# Patient Record
Sex: Male | Born: 1955 | Race: White | Hispanic: No | Marital: Married | Smoking: Never smoker
Health system: Southern US, Community
[De-identification: ages and names within clinical notes are randomized; demographics above are authoritative.]

## PROBLEM LIST (undated history)

## (undated) DIAGNOSIS — I2699 Other pulmonary embolism without acute cor pulmonale: Secondary | ICD-10-CM

## (undated) DIAGNOSIS — I82409 Acute embolism and thrombosis of unspecified deep veins of unspecified lower extremity: Secondary | ICD-10-CM

## (undated) DIAGNOSIS — G459 Transient cerebral ischemic attack, unspecified: Secondary | ICD-10-CM

## (undated) DIAGNOSIS — O2230 Deep phlebothrombosis in pregnancy, unspecified trimester: Secondary | ICD-10-CM

## (undated) DIAGNOSIS — F32A Depression, unspecified: Secondary | ICD-10-CM

## (undated) DIAGNOSIS — J3800 Paralysis of vocal cords and larynx, unspecified: Secondary | ICD-10-CM

## (undated) DIAGNOSIS — F329 Major depressive disorder, single episode, unspecified: Secondary | ICD-10-CM

## (undated) HISTORY — PX: APPENDECTOMY: SHX54

## (undated) HISTORY — PX: THROAT SURGERY: SHX803

---

## 2008-09-21 ENCOUNTER — Encounter: Admission: RE | Admit: 2008-09-21 | Discharge: 2008-09-29 | Payer: Self-pay | Admitting: Psychology

## 2008-10-02 ENCOUNTER — Ambulatory Visit: Payer: Self-pay | Admitting: Psychology

## 2009-06-14 ENCOUNTER — Encounter: Admission: RE | Admit: 2009-06-14 | Discharge: 2009-06-14 | Payer: Self-pay | Admitting: Orthopedic Surgery

## 2009-10-18 ENCOUNTER — Encounter: Admission: RE | Admit: 2009-10-18 | Discharge: 2009-10-18 | Payer: Self-pay | Admitting: Orthopedic Surgery

## 2009-10-21 ENCOUNTER — Ambulatory Visit: Payer: Self-pay | Admitting: Hematology & Oncology

## 2009-11-01 LAB — CBC WITH DIFFERENTIAL (CANCER CENTER ONLY)
BASO#: 0 10*3/uL (ref 0.0–0.2)
Eosinophils Absolute: 0.1 10*3/uL (ref 0.0–0.5)
HGB: 15.1 g/dL (ref 13.0–17.1)
LYMPH#: 1.3 10*3/uL (ref 0.9–3.3)
MCH: 32.4 pg (ref 28.0–33.4)
MONO#: 0.3 10*3/uL (ref 0.1–0.9)
NEUT#: 2.3 10*3/uL (ref 1.5–6.5)
Platelets: 210 10*3/uL (ref 145–400)
RBC: 4.65 10*6/uL (ref 4.20–5.70)
WBC: 4 10*3/uL (ref 4.0–10.0)

## 2009-11-01 LAB — CHCC SATELLITE - SMEAR

## 2009-11-08 LAB — HYPERCOAGULABLE PANEL, COMPREHENSIVE
Anticardiolipin IgG: 4 GPL U/mL (ref <23)
DRVVT 1:1 Mix: 46 secs — ABNORMAL HIGH (ref 36.2–44.3)
DRVVT: 80.9 secs — ABNORMAL HIGH (ref 36.2–44.3)
Drvvt confirmation: 1.42 Ratio — ABNORMAL HIGH (ref <1.21)
Homocysteine: 10.1 umol/L (ref 4.0–15.4)
Lupus Anticoagulant: DETECTED — AB
PTT Lupus Anticoagulant: 41 secs (ref 32.0–43.4)
Protein C Activity: 68 % — ABNORMAL LOW (ref 75–133)
Protein C, Total: 90 % (ref 70–140)
Protein S Ag, Total: 92 % (ref 70–140)

## 2009-11-08 LAB — D-DIMER, QUANTITATIVE: D-Dimer, Quant: 0.22 ug/mL-FEU (ref 0.00–0.48)

## 2011-05-31 DIAGNOSIS — J3801 Paralysis of vocal cords and larynx, unilateral: Secondary | ICD-10-CM | POA: Insufficient documentation

## 2011-11-20 DIAGNOSIS — Z86711 Personal history of pulmonary embolism: Secondary | ICD-10-CM | POA: Insufficient documentation

## 2012-03-22 DIAGNOSIS — M542 Cervicalgia: Secondary | ICD-10-CM

## 2012-03-22 HISTORY — DX: Cervicalgia: M54.2

## 2012-08-06 DIAGNOSIS — E785 Hyperlipidemia, unspecified: Secondary | ICD-10-CM | POA: Insufficient documentation

## 2012-08-06 DIAGNOSIS — I2699 Other pulmonary embolism without acute cor pulmonale: Secondary | ICD-10-CM | POA: Insufficient documentation

## 2012-10-15 DIAGNOSIS — Z7901 Long term (current) use of anticoagulants: Secondary | ICD-10-CM | POA: Insufficient documentation

## 2012-10-15 DIAGNOSIS — Z86718 Personal history of other venous thrombosis and embolism: Secondary | ICD-10-CM

## 2012-10-15 DIAGNOSIS — M47812 Spondylosis without myelopathy or radiculopathy, cervical region: Secondary | ICD-10-CM | POA: Insufficient documentation

## 2012-10-15 HISTORY — DX: Personal history of other venous thrombosis and embolism: Z86.718

## 2012-10-16 ENCOUNTER — Telehealth: Payer: Self-pay | Admitting: Hematology & Oncology

## 2012-10-16 NOTE — Telephone Encounter (Signed)
Rcvd fax from [facility] requesting progress and labs notes for this patient. Pt has no notes and labs are old (2011).  Sent fax requested form back saying no notes available and labs are old at this time.    CONSENT COPY SCANNED

## 2015-07-07 DIAGNOSIS — F419 Anxiety disorder, unspecified: Secondary | ICD-10-CM | POA: Insufficient documentation

## 2015-07-07 DIAGNOSIS — G4700 Insomnia, unspecified: Secondary | ICD-10-CM | POA: Insufficient documentation

## 2016-01-10 DIAGNOSIS — M1A9XX0 Chronic gout, unspecified, without tophus (tophi): Secondary | ICD-10-CM | POA: Insufficient documentation

## 2017-03-11 ENCOUNTER — Emergency Department (HOSPITAL_COMMUNITY): Payer: Self-pay

## 2017-03-11 ENCOUNTER — Encounter (HOSPITAL_COMMUNITY): Payer: Self-pay | Admitting: Emergency Medicine

## 2017-03-11 ENCOUNTER — Emergency Department (HOSPITAL_COMMUNITY)
Admission: EM | Admit: 2017-03-11 | Discharge: 2017-03-12 | Disposition: A | Discharge status: Home / Self Care | Payer: Self-pay | Attending: Emergency Medicine | Admitting: Emergency Medicine

## 2017-03-11 DIAGNOSIS — R911 Solitary pulmonary nodule: Secondary | ICD-10-CM | POA: Insufficient documentation

## 2017-03-11 DIAGNOSIS — Z79899 Other long term (current) drug therapy: Secondary | ICD-10-CM | POA: Insufficient documentation

## 2017-03-11 DIAGNOSIS — R109 Unspecified abdominal pain: Secondary | ICD-10-CM | POA: Insufficient documentation

## 2017-03-11 HISTORY — DX: Depression, unspecified: F32.A

## 2017-03-11 HISTORY — DX: Other pulmonary embolism without acute cor pulmonale: I26.99

## 2017-03-11 HISTORY — DX: Paralysis of vocal cords and larynx, unspecified: J38.00

## 2017-03-11 HISTORY — DX: Major depressive disorder, single episode, unspecified: F32.9

## 2017-03-11 HISTORY — DX: Deep phlebothrombosis in pregnancy, unspecified trimester: O22.30

## 2017-03-11 LAB — LIPASE, BLOOD: LIPASE: 18 U/L (ref 11–51)

## 2017-03-11 LAB — URINALYSIS, ROUTINE W REFLEX MICROSCOPIC
BILIRUBIN URINE: NEGATIVE
Bacteria, UA: NONE SEEN
GLUCOSE, UA: NEGATIVE mg/dL
Hgb urine dipstick: NEGATIVE
KETONES UR: 5 mg/dL — AB
LEUKOCYTES UA: NEGATIVE
Nitrite: NEGATIVE
PH: 5 (ref 5.0–8.0)
Protein, ur: 30 mg/dL — AB
Specific Gravity, Urine: 1.028 (ref 1.005–1.030)

## 2017-03-11 LAB — I-STAT TROPONIN, ED: Troponin i, poc: 0.01 ng/mL (ref 0.00–0.08)

## 2017-03-11 LAB — COMPREHENSIVE METABOLIC PANEL
ALK PHOS: 59 U/L (ref 38–126)
ALT: 23 U/L (ref 17–63)
AST: 29 U/L (ref 15–41)
Albumin: 3.8 g/dL (ref 3.5–5.0)
Anion gap: 8 (ref 5–15)
BUN: 13 mg/dL (ref 6–20)
CALCIUM: 8.8 mg/dL — AB (ref 8.9–10.3)
CO2: 25 mmol/L (ref 22–32)
CREATININE: 0.92 mg/dL (ref 0.61–1.24)
Chloride: 102 mmol/L (ref 101–111)
Glucose, Bld: 122 mg/dL — ABNORMAL HIGH (ref 65–99)
Potassium: 3.9 mmol/L (ref 3.5–5.1)
Sodium: 135 mmol/L (ref 135–145)
TOTAL PROTEIN: 6.8 g/dL (ref 6.5–8.1)
Total Bilirubin: 0.8 mg/dL (ref 0.3–1.2)

## 2017-03-11 LAB — CBC
HCT: 43.2 % (ref 39.0–52.0)
Hemoglobin: 14.7 g/dL (ref 13.0–17.0)
MCH: 32 pg (ref 26.0–34.0)
MCHC: 34 g/dL (ref 30.0–36.0)
MCV: 93.9 fL (ref 78.0–100.0)
PLATELETS: 168 10*3/uL (ref 150–400)
RBC: 4.6 MIL/uL (ref 4.22–5.81)
RDW: 13.4 % (ref 11.5–15.5)
WBC: 7.1 10*3/uL (ref 4.0–10.5)

## 2017-03-11 MED ORDER — METOCLOPRAMIDE HCL 5 MG/ML IJ SOLN
10.0000 mg | Freq: Once | INTRAMUSCULAR | Status: AC
  Administered 2017-03-11: 10 mg via INTRAVENOUS
  Filled 2017-03-11: qty 2

## 2017-03-11 MED ORDER — SODIUM CHLORIDE 0.9 % IV BOLUS (SEPSIS)
1000.0000 mL | Freq: Once | INTRAVENOUS | Status: AC
  Administered 2017-03-11: 1000 mL via INTRAVENOUS

## 2017-03-11 MED ORDER — KETOROLAC TROMETHAMINE 30 MG/ML IJ SOLN
15.0000 mg | Freq: Once | INTRAMUSCULAR | Status: AC
  Administered 2017-03-11: 15 mg via INTRAVENOUS
  Filled 2017-03-11: qty 1

## 2017-03-11 MED ORDER — MORPHINE SULFATE (PF) 4 MG/ML IV SOLN
4.0000 mg | Freq: Once | INTRAVENOUS | Status: AC
  Administered 2017-03-11: 4 mg via INTRAVENOUS
  Filled 2017-03-11: qty 1

## 2017-03-11 MED ORDER — ONDANSETRON HCL 4 MG/2ML IJ SOLN
4.0000 mg | Freq: Once | INTRAMUSCULAR | Status: AC
  Administered 2017-03-11: 4 mg via INTRAVENOUS
  Filled 2017-03-11: qty 2

## 2017-03-11 NOTE — ED Triage Notes (Signed)
C/o severe nausea since yesterday.  Took Phenergan 2-3 hours ago without relief.  Reports posterior R rib pain that he believes is from recent coughing.  Decreased urination and foul smelling urine.

## 2017-03-11 NOTE — ED Provider Notes (Signed)
[facility]-EMERGENCY DEPT Provider Note   CSN: [id number] Arrival date & time: 03/11/17  1937     History   Chief Complaint Chief Complaint  Patient presents with  . Nausea  . R posterior rib pain    HPI Lawrence Carlson is a 61 y.o. male.  HPI Patient presents with concern of nausea, weakness, right posterior inferior axillary pain. About one week ago the patient developed generalized fatigue, mild cough. Over the past 2 days or so the patient felt increasing pain in the right flank, and lower ribs anteriorly. No dysuria, no hematuria. No other abdominal pain, no chest pain. No dyspnea. No fever. Patient is here with his son in law who assists with the history of present illness. Since onset minimal relief with OTC medication. Patient is a notable history of DVT/PE, but is no longer on anticoagulant. No recent medication changes, travel, diet changes, activity changes. Past Medical History:  Diagnosis Date  . Depression   . DVT (deep vein thrombosis) in pregnancy (HCC)   . Paralyzed vocal cords   . Pulmonary embolism (HCC)     There are no active problems to display for this patient.   Past Surgical History:  Procedure Laterality Date  . APPENDECTOMY    . THROAT SURGERY         Home Medications    Prior to Admission medications   Medication Sig Start Date End Date Taking? Authorizing Provider  Calcium-Magnesium-Vitamin D (CALCIUM MAGNESIUM PO) Take 1 tablet by mouth daily.   Yes [provider]  clonazePAM (KLONOPIN) 0.5 MG tablet Take 0.5 mg by mouth 2 (two) times daily as needed (to relax vocal cords).   Yes [provider]  escitalopram (LEXAPRO) 10 MG tablet Take 10 mg by mouth at bedtime.   Yes [provider]  Multiple Vitamin (MULTIVITAMIN WITH MINERALS) TABS tablet Take 1 tablet by mouth daily.   Yes [provider]  vitamin B-12 (CYANOCOBALAMIN) 1000 MCG tablet Take 5,000 mcg by mouth daily.   Yes [provider]   zolpidem (AMBIEN) 5 MG tablet Take 5 mg by mouth at bedtime.   Yes [provider]    Family History No family history on file.  Social History Social History  Substance Use Topics  . Smoking status: Never Smoker  . Smokeless tobacco: Never Used  . Alcohol use No     Allergies   Patient has no known allergies.   Review of Systems Review of Systems  Constitutional:       Per HPI, otherwise negative  HENT:       Per HPI, otherwise negative  Respiratory:       Per HPI, otherwise negative  Cardiovascular:       Per HPI, otherwise negative  Gastrointestinal: Positive for nausea. Negative for vomiting.  Endocrine:       Negative aside from HPI  Genitourinary:       Neg aside from HPI   Musculoskeletal:       Per HPI, otherwise negative  Skin: Negative.   Neurological: Positive for weakness. Negative for syncope.     Physical Exam Updated Vital Signs BP 130/87 (BP Location: Left Arm)   Pulse 60   Temp 97.8 F (36.6 C) (Oral)   Resp 16   Ht  (1.803 m)   Wt 85.3 kg (188 lb)   SpO2 100%   BMI 26.22 kg/m   Physical Exam  Constitutional: He is oriented to person, place, and time. He appears  well-developed. No distress.  Uncomfortable appearing male resting in supine position, answering questions appropriately  HENT:  Head: Normocephalic and atraumatic.  Eyes: Conjunctivae and EOM are normal.  Cardiovascular: Normal rate and regular rhythm.   Pulmonary/Chest: Effort normal. No stridor. No respiratory distress.  Abdominal: He exhibits no distension.    Musculoskeletal: He exhibits no edema.  Neurological: He is alert and oriented to person, place, and time.  Skin: Skin is warm and dry.  Psychiatric: He has a normal mood and affect.  Nursing note and vitals reviewed.    ED Treatments / Results  Labs (all labs ordered are listed, but only abnormal results are displayed) Labs Reviewed  COMPREHENSIVE METABOLIC PANEL - Abnormal; Notable for  the following:       Result Value   Glucose, Bld 122 (*)    Calcium 8.8 (*)    All other components within normal limits  LIPASE, BLOOD  CBC  URINALYSIS, ROUTINE W REFLEX MICROSCOPIC  I-STAT TROPONIN, ED    EKG  EKG Interpretation  Date/Time:  Sunday March 11 2017 19:50:22 EDT Ventricular Rate:  51 PR Interval:  166 QRS Duration: 82 QT Interval:  428 QTC Calculation: 394 R Axis:   78 Text Interpretation:  Sinus bradycardia Otherwise within normal limits Confirmed by Gerhard Munch [phone]) on 03/11/2017 8:23:33 PM       Radiology Dg Ribs Unilateral W/chest Right  Result Date: 03/11/2017 CLINICAL DATA:  Right rib pain.  Coughing for 2 weeks. EXAM: RIGHT RIBS AND CHEST - 3+ VIEW COMPARISON:  07/25/2015, 03/15/2011 FINDINGS: Right rib detail images are negative for fracture or focal bone abnormality. The lungs are clear. The pulmonary vasculature is normal. There is no pleural effusion. Heart size is normal. Hilar and mediastinal contours are unremarkable. Chronic shoulder arthropathy bilaterally. IMPRESSION: No acute findings. Electronically Signed   By: Ellery Plunk M.D.   On: 03/11/2017 20:34    Procedures Procedures (including critical care time)  Medications Ordered in ED Medications  sodium chloride 0.9 % bolus 1,000 mL (not administered)  sodium chloride 0.9 % bolus 1,000 mL (1,000 mLs Intravenous New Bag/Given 03/11/17 2048)  ondansetron (ZOFRAN) injection 4 mg (4 mg Intravenous Given 03/11/17 2048)  ketorolac (TORADOL) 30 MG/ML injection 15 mg (15 mg Intravenous Given 03/11/17 2120)  morphine 4 MG/ML injection 4 mg (4 mg Intravenous Given 03/11/17 2122)     Initial Impression / Assessment and Plan / ED Course  I have reviewed the triage vital signs and the nursing notes.  Pertinent labs & imaging results that were available during my care of the patient were reviewed by me and considered in my medical decision making (see chart for details).      Update: After initial resuscitation with fluids, antiemetics, analgesia, the patient continues to have pain, nausea.   Update:, Labs, urinalysis, x-ray reviewed the patient and his family member. Initial studies reassuring. After multiple medications, patient remains symptomatic, and given concern for his location of pain in the right upper abdomen, right flank, patient wants CT scan performed. Labs, vitals as far reassuring, patient's care endorsed to Dr. Wilkie Aye, who will evaluate the CT results and check on the patient.   Final Clinical Impressions(s) / ED Diagnoses  Right sided pain Nausea   Gerhard Munch, MD 03/12/17 0013

## 2017-03-12 ENCOUNTER — Emergency Department (HOSPITAL_COMMUNITY): Payer: Self-pay

## 2017-03-12 MED ORDER — HYDROMORPHONE HCL 1 MG/ML IJ SOLN
0.5000 mg | Freq: Once | INTRAMUSCULAR | Status: AC
  Administered 2017-03-12: 0.5 mg via INTRAVENOUS
  Filled 2017-03-12: qty 1

## 2017-03-12 MED ORDER — IOPAMIDOL (ISOVUE-300) INJECTION 61%
INTRAVENOUS | Status: AC
  Filled 2017-03-12: qty 100

## 2017-03-12 MED ORDER — IOPAMIDOL (ISOVUE-300) INJECTION 61%
100.0000 mL | Freq: Once | INTRAVENOUS | Status: AC | PRN
  Administered 2017-03-12: 100 mL via INTRAVENOUS

## 2017-03-12 NOTE — ED Notes (Signed)
Pt taken to CT.

## 2017-03-12 NOTE — ED Notes (Signed)
Provider at bedside

## 2017-03-12 NOTE — Discharge Instructions (Signed)
Workup today is reassuring. Follow-up with her primary physician. You do have evidence of a slow growing pulmonary nodule. Follow-up CT-PET recommended.

## 2017-03-12 NOTE — ED Provider Notes (Signed)
Patient signed out pending CT scan.    CT scan negative for acute finding. There is a slow growing pulmonary nodule which the patient states he is aware of. Recommend follow-up PET/CT.  On recheck, patient states he feels more comfortable. He is able to tolerate fluids. Will discharge home with primary care follow-up.   Shon Baton, MD 03/12/17 [phone]

## 2017-04-10 ENCOUNTER — Other Ambulatory Visit (HOSPITAL_COMMUNITY): Payer: Self-pay | Admitting: Family Medicine

## 2017-04-10 DIAGNOSIS — R911 Solitary pulmonary nodule: Secondary | ICD-10-CM

## 2017-04-24 ENCOUNTER — Encounter (HOSPITAL_COMMUNITY): Payer: Self-pay

## 2017-05-04 ENCOUNTER — Encounter (HOSPITAL_COMMUNITY)
Admission: RE | Admit: 2017-05-04 | Discharge: 2017-05-04 | Disposition: A | Discharge status: Home / Self Care | Payer: Self-pay | Source: Ambulatory Visit | Attending: Family Medicine | Admitting: Family Medicine

## 2017-05-04 DIAGNOSIS — R911 Solitary pulmonary nodule: Secondary | ICD-10-CM | POA: Insufficient documentation

## 2017-05-04 LAB — GLUCOSE, CAPILLARY: GLUCOSE-CAPILLARY: 93 mg/dL (ref 65–99)

## 2017-05-04 MED ORDER — FLUDEOXYGLUCOSE F - 18 (FDG) INJECTION
9.3500 | Freq: Once | INTRAVENOUS | Status: AC | PRN
  Administered 2017-05-04: 9.35 via INTRAVENOUS

## 2017-08-03 DIAGNOSIS — R911 Solitary pulmonary nodule: Secondary | ICD-10-CM | POA: Insufficient documentation

## 2018-08-23 ENCOUNTER — Telehealth (INDEPENDENT_AMBULATORY_CARE_PROVIDER_SITE_OTHER): Payer: Self-pay

## 2018-08-23 NOTE — Telephone Encounter (Signed)
Pt was called to get prescreen for COVID-19 but no answer and could not lvm at this time, box full.

## 2018-08-26 ENCOUNTER — Other Ambulatory Visit: Payer: Self-pay

## 2018-08-26 ENCOUNTER — Ambulatory Visit (INDEPENDENT_AMBULATORY_CARE_PROVIDER_SITE_OTHER): Payer: Self-pay | Admitting: Orthopedic Surgery

## 2018-08-26 ENCOUNTER — Encounter (INDEPENDENT_AMBULATORY_CARE_PROVIDER_SITE_OTHER): Payer: Self-pay | Admitting: Orthopedic Surgery

## 2018-08-26 ENCOUNTER — Ambulatory Visit (INDEPENDENT_AMBULATORY_CARE_PROVIDER_SITE_OTHER): Payer: Self-pay

## 2018-08-26 VITALS — Ht 71.0 in | Wt 188.0 lb

## 2018-08-26 DIAGNOSIS — M79671 Pain in right foot: Secondary | ICD-10-CM

## 2018-08-26 DIAGNOSIS — M7741 Metatarsalgia, right foot: Secondary | ICD-10-CM

## 2018-08-26 DIAGNOSIS — M6701 Short Achilles tendon (acquired), right ankle: Secondary | ICD-10-CM

## 2018-08-26 NOTE — Progress Notes (Signed)
Office Visit Note   Patient: Lawrence Carlson           Date of Birth: [birth year]           MRN: [id number] Visit Date: 08/26/2018              Requested by: Titus Dubin., MD [address] [place], [place] [address] PCP: Titus Dubin., MD  Chief Complaint  Patient presents with  . Right Foot - Pain      HPI: Patient is a 63 year old gentleman who presents for evaluation of right greater than left foot pain primarily over the base of the fifth metatarsal.  Patient states that he does run about 4 miles 3 times a week and the pain is worse with running.  He states he has no pain with regular walking activities.  Assessment & Plan: Visit Diagnoses:  1. Pain in right foot   2. Achilles tendon contracture, right   3. Metatarsalgia, right foot     Plan: Patient was given instructions for Achilles stretching this was demonstrated to do 5 times a day a minute at a time.  Patient was instructed to cut out a relief for the first metatarsal head and great toe of the right shoe orthotic to allow for plantarflexion the first ray to unload the lateral aspect of his foot.  Patient was also recommended getting a pair of Trail running sneakers that would be stiff for to unload the lateral aspect of his foot.  Follow-Up Instructions: Return if symptoms worsen or fail to improve.   Ortho Exam  Patient is alert, oriented, no adenopathy, well-dressed, normal affect, normal respiratory effort. Examination patient has a good pulse he has heel cord contracture of the right greater the left with dorsiflexion about 10 degrees short of neutral on the right.  Patient has decreased subtalar motion bilaterally with a cavovarus foot with a plantarflexed first ray which is causing overload on the lateral column there is callus laterally but no ulcers.  Imaging: Xr Foot 2 Views Right  Result Date: 08/26/2018 2 view radiographs of the right foot shows some arthritis of the tibiotalar joint as  well as the talonavicular joint.  In the AP view there is no evidence of a stress fracture of the fifth metatarsal.  No images are attached to the encounter.  Labs: No results found for: HGBA1C, ESRSEDRATE, CRP, LABURIC, REPTSTATUS, GRAMSTAIN, CULT, LABORGA   Lab Results  Component Value Date   ALBUMIN 3.8 03/11/2017    Body mass index is 26.22 kg/m.  Orders:  Orders Placed This Encounter  Procedures  . XR Foot 2 Views Right   No orders of the defined types were placed in this encounter.    Procedures: No procedures performed  Clinical Data: No additional findings.  ROS:  All other systems negative, except as noted in the HPI. Review of Systems  Objective: Vital Signs: Ht 5\' 11"  (1.803 m)   Wt 188 lb (85.3 kg)   BMI 26.22 kg/m   Specialty Comments:  No specialty comments available.  PMFS History: There are no active problems to display for this patient.  Past Medical History:  Diagnosis Date  . Depression   . DVT (deep vein thrombosis) in pregnancy   . Paralyzed vocal cords   . Pulmonary embolism (HCC)     History reviewed. No pertinent family history.  Past Surgical History:  Procedure Laterality Date  . APPENDECTOMY    . THROAT SURGERY  Social History   Occupational History  . Not on file  Tobacco Use  . Smoking status: Never Smoker  . Smokeless tobacco: Never Used  Substance and Sexual Activity  . Alcohol use: No  . Drug use: No  . Sexual activity: Not on file       

## 2018-09-16 ENCOUNTER — Encounter: Payer: Self-pay | Admitting: Podiatry

## 2018-09-16 ENCOUNTER — Other Ambulatory Visit: Payer: Self-pay

## 2018-09-16 ENCOUNTER — Ambulatory Visit (INDEPENDENT_AMBULATORY_CARE_PROVIDER_SITE_OTHER): Payer: Self-pay | Admitting: Podiatry

## 2018-09-16 VITALS — BP 117/70 | HR 70 | Temp 97.0°F

## 2018-09-16 DIAGNOSIS — M79672 Pain in left foot: Secondary | ICD-10-CM

## 2018-09-16 DIAGNOSIS — M19079 Primary osteoarthritis, unspecified ankle and foot: Secondary | ICD-10-CM

## 2018-09-16 DIAGNOSIS — M216X9 Other acquired deformities of unspecified foot: Secondary | ICD-10-CM

## 2018-09-16 DIAGNOSIS — M79671 Pain in right foot: Secondary | ICD-10-CM

## 2018-09-16 NOTE — Progress Notes (Signed)
   HPI: 63 year old male presents the office today as a new patient for evaluation regarding bilateral foot and ankle pain.  Patient is an avid runner and noticed pain with a new pair shoes.  Pain is been ongoing for approximately 1 month now.  Patient was seen by another physician here in town and over-the-counter insoles were recommended.  He presents for second opinion and for further treatment evaluation  Past Medical History:  Diagnosis Date  . Depression   . DVT (deep vein thrombosis) in pregnancy   . Paralyzed vocal cords   . Pulmonary embolism [facility])      Physical Exam: General: The patient is alert and oriented x3 in no acute distress.  Dermatology: Skin is warm, dry and supple bilateral lower extremities. Negative for open lesions or macerations.  Vascular: Palpable pedal pulses bilaterally. No edema or erythema noted. Capillary refill within normal limits.  Neurological: Epicritic and protective threshold grossly intact bilaterally.   Musculoskeletal Exam: Range of motion within normal limits to all pedal and ankle joints bilateral. Muscle strength 5/5 in all groups bilateral.  There is some pain on palpation noted to the insertion of the peroneal tendon to the fifth metatarsal tubercle right foot.  Cavus foot type also noted with increased longitudinal arch and lateral column weightbearing.  There is also some crepitus and limited range of motion bilaterally  Assessment: 1.  Insertional peroneal tendinitis right 2.  DJD bilateral foot and ankle 3.  Cavus foot type bilateral  Plan of Care:  1. Patient evaluated.  2.  Injection of 0.5 cc Celestone Soluspan injected into the area of the insertion of the peroneal tendon to the fifth metatarsal tubercle right 3.  Appointment with Raiford Noble was made today for custom molded insoles 4.  Return to clinic as needed      Felecia Shelling, DPM [facility]  Dr. Felecia Shelling, DPM    [address] [place], [place] [address]                Office [phone]  Fax [phone]

## 2018-09-17 ENCOUNTER — Other Ambulatory Visit: Payer: Self-pay

## 2018-09-17 ENCOUNTER — Ambulatory Visit (INDEPENDENT_AMBULATORY_CARE_PROVIDER_SITE_OTHER): Payer: Self-pay | Admitting: Orthotics

## 2018-09-17 DIAGNOSIS — F419 Anxiety disorder, unspecified: Secondary | ICD-10-CM

## 2018-09-17 DIAGNOSIS — M19079 Primary osteoarthritis, unspecified ankle and foot: Secondary | ICD-10-CM

## 2018-09-17 DIAGNOSIS — M216X9 Other acquired deformities of unspecified foot: Secondary | ICD-10-CM

## 2018-09-17 NOTE — Progress Notes (Signed)
Patient came in today for evaluation/assessment custom foot orthotics.  Patient presents foot pain and discomfort associated with plantar fasciitis.  Patient has noted pes cavus foot type with also rear foot varus deformity. ...Goal is to "bring ground up" with contoured arch support, and 4 degree valgus RF and FF posting.     

## 2018-10-15 ENCOUNTER — Ambulatory Visit: Payer: Self-pay | Admitting: Orthotics

## 2018-10-15 ENCOUNTER — Other Ambulatory Visit: Payer: Self-pay

## 2018-10-15 DIAGNOSIS — M216X9 Other acquired deformities of unspecified foot: Secondary | ICD-10-CM

## 2018-10-15 DIAGNOSIS — F419 Anxiety disorder, unspecified: Secondary | ICD-10-CM

## 2018-10-15 DIAGNOSIS — M19079 Primary osteoarthritis, unspecified ankle and foot: Secondary | ICD-10-CM

## 2018-10-15 DIAGNOSIS — M79671 Pain in right foot: Secondary | ICD-10-CM

## 2018-10-15 NOTE — Progress Notes (Signed)
Patient came in today to pick up custom made foot orthotics.  The goals were accomplished and the patient reported no dissatisfaction with said orthotics.  Patient was advised of breakin period and how to report any issues. 

## 2019-01-20 ENCOUNTER — Other Ambulatory Visit: Payer: Self-pay

## 2019-01-20 ENCOUNTER — Ambulatory Visit: Payer: Self-pay | Admitting: Orthotics

## 2019-01-20 DIAGNOSIS — M19079 Primary osteoarthritis, unspecified ankle and foot: Secondary | ICD-10-CM

## 2019-01-20 DIAGNOSIS — F419 Anxiety disorder, unspecified: Secondary | ICD-10-CM

## 2019-01-20 NOTE — Progress Notes (Signed)
Added 1/4" valgus wedging RIGHT foot to keep foot from rolling out; patietn is concerned that knee/hip problems keep him rolling in.and out balance.

## 2019-12-15 ENCOUNTER — Ambulatory Visit: Payer: Self-pay | Admitting: Podiatry

## 2020-04-15 ENCOUNTER — Ambulatory Visit: Payer: Medicare HMO | Admitting: Orthotics

## 2020-04-15 ENCOUNTER — Other Ambulatory Visit: Payer: Self-pay

## 2020-04-15 DIAGNOSIS — M216X9 Other acquired deformities of unspecified foot: Secondary | ICD-10-CM

## 2020-04-15 DIAGNOSIS — M19079 Primary osteoarthritis, unspecified ankle and foot: Secondary | ICD-10-CM

## 2020-04-15 NOTE — Progress Notes (Signed)
Repeat previous order, add 3* FF wedging/4* RF lateral wedging.

## 2020-06-25 ENCOUNTER — Telehealth: Payer: Self-pay | Admitting: Orthotics

## 2020-06-25 NOTE — Telephone Encounter (Signed)
Pt called checking status of orthotics ordered in November of 2021.  I see an order that was placed in [place] on 1.4.22.Marland Kitchen

## 2020-09-14 ENCOUNTER — Other Ambulatory Visit: Payer: Self-pay

## 2020-09-14 ENCOUNTER — Emergency Department (HOSPITAL_COMMUNITY)
Admission: EM | Admit: 2020-09-14 | Discharge: 2020-09-16 | Disposition: A | Discharge status: Home / Self Care | Payer: Medicare HMO | Attending: Emergency Medicine | Admitting: Emergency Medicine

## 2020-09-14 DIAGNOSIS — Z79899 Other long term (current) drug therapy: Secondary | ICD-10-CM | POA: Insufficient documentation

## 2020-09-14 DIAGNOSIS — F329 Major depressive disorder, single episode, unspecified: Secondary | ICD-10-CM | POA: Diagnosis not present

## 2020-09-14 DIAGNOSIS — R462 Strange and inexplicable behavior: Secondary | ICD-10-CM

## 2020-09-14 DIAGNOSIS — R739 Hyperglycemia, unspecified: Secondary | ICD-10-CM | POA: Diagnosis not present

## 2020-09-14 DIAGNOSIS — Z7901 Long term (current) use of anticoagulants: Secondary | ICD-10-CM | POA: Diagnosis not present

## 2020-09-14 DIAGNOSIS — Z86718 Personal history of other venous thrombosis and embolism: Secondary | ICD-10-CM | POA: Diagnosis not present

## 2020-09-14 DIAGNOSIS — Z20822 Contact with and (suspected) exposure to covid-19: Secondary | ICD-10-CM | POA: Insufficient documentation

## 2020-09-14 DIAGNOSIS — F39 Unspecified mood [affective] disorder: Secondary | ICD-10-CM

## 2020-09-14 DIAGNOSIS — R456 Violent behavior: Secondary | ICD-10-CM | POA: Insufficient documentation

## 2020-09-14 DIAGNOSIS — R4689 Other symptoms and signs involving appearance and behavior: Secondary | ICD-10-CM | POA: Diagnosis present

## 2020-09-14 DIAGNOSIS — R259 Unspecified abnormal involuntary movements: Secondary | ICD-10-CM | POA: Diagnosis present

## 2020-09-14 LAB — URINALYSIS, ROUTINE W REFLEX MICROSCOPIC
Bilirubin Urine: NEGATIVE
Glucose, UA: NEGATIVE mg/dL
Hgb urine dipstick: NEGATIVE
Ketones, ur: NEGATIVE mg/dL
Leukocytes,Ua: NEGATIVE
Nitrite: NEGATIVE
Protein, ur: NEGATIVE mg/dL
Specific Gravity, Urine: 1.017 (ref 1.005–1.030)
pH: 7 (ref 5.0–8.0)

## 2020-09-14 LAB — RESP PANEL BY RT-PCR (FLU A&B, COVID) ARPGX2
Influenza A by PCR: NEGATIVE
Influenza B by PCR: NEGATIVE
SARS Coronavirus 2 by RT PCR: NEGATIVE

## 2020-09-14 LAB — RAPID URINE DRUG SCREEN, HOSP PERFORMED
Amphetamines: NOT DETECTED
Barbiturates: NOT DETECTED
Benzodiazepines: POSITIVE — AB
Cocaine: NOT DETECTED
Opiates: NOT DETECTED
Tetrahydrocannabinol: NOT DETECTED

## 2020-09-14 LAB — CBC WITH DIFFERENTIAL/PLATELET
Abs Immature Granulocytes: 0.02 10*3/uL (ref 0.00–0.07)
Basophils Absolute: 0 10*3/uL (ref 0.0–0.1)
Basophils Relative: 1 %
Eosinophils Absolute: 0.1 10*3/uL (ref 0.0–0.5)
Eosinophils Relative: 1 %
HCT: 41.7 % (ref 39.0–52.0)
Hemoglobin: 14.1 g/dL (ref 13.0–17.0)
Immature Granulocytes: 0 %
Lymphocytes Relative: 19 %
Lymphs Abs: 1.1 10*3/uL (ref 0.7–4.0)
MCH: 32.4 pg (ref 26.0–34.0)
MCHC: 33.8 g/dL (ref 30.0–36.0)
MCV: 95.9 fL (ref 80.0–100.0)
Monocytes Absolute: 0.6 10*3/uL (ref 0.1–1.0)
Monocytes Relative: 11 %
Neutro Abs: 3.8 10*3/uL (ref 1.7–7.7)
Neutrophils Relative %: 68 %
Platelets: 184 10*3/uL (ref 150–400)
RBC: 4.35 MIL/uL (ref 4.22–5.81)
RDW: 13.4 % (ref 11.5–15.5)
WBC: 5.6 10*3/uL (ref 4.0–10.5)
nRBC: 0 % (ref 0.0–0.2)

## 2020-09-14 LAB — COMPREHENSIVE METABOLIC PANEL
ALT: 34 U/L (ref 0–44)
AST: 37 U/L (ref 15–41)
Albumin: 4.1 g/dL (ref 3.5–5.0)
Alkaline Phosphatase: 54 U/L (ref 38–126)
Anion gap: 8 (ref 5–15)
BUN: 27 mg/dL — ABNORMAL HIGH (ref 8–23)
CO2: 25 mmol/L (ref 22–32)
Calcium: 9.2 mg/dL (ref 8.9–10.3)
Chloride: 105 mmol/L (ref 98–111)
Creatinine, Ser: 0.98 mg/dL (ref 0.61–1.24)
GFR, Estimated: >60 mL/min (ref >60)
Glucose, Bld: 123 mg/dL — ABNORMAL HIGH (ref 70–99)
Potassium: 4.1 mmol/L (ref 3.5–5.1)
Sodium: 138 mmol/L (ref 135–145)
Total Bilirubin: 0.6 mg/dL (ref 0.3–1.2)
Total Protein: 7.1 g/dL (ref 6.5–8.1)

## 2020-09-14 LAB — ETHANOL: Alcohol, Ethyl (B): <10 mg/dL (ref <10)

## 2020-09-14 LAB — SALICYLATE LEVEL: Salicylate Lvl: <7 mg/dL — ABNORMAL LOW (ref 7.0–30.0)

## 2020-09-14 LAB — ACETAMINOPHEN LEVEL: Acetaminophen (Tylenol), Serum: <10 ug/mL — ABNORMAL LOW (ref 10–30)

## 2020-09-14 MED ORDER — ESCITALOPRAM OXALATE 10 MG PO TABS: 10.0000 mg | ORAL_TABLET | Freq: Every day | ORAL | Status: DC

## 2020-09-14 MED ORDER — ZOLPIDEM TARTRATE 5 MG PO TABS
5.0000 mg | ORAL_TABLET | Freq: Every day | ORAL | Status: DC
  Administered 2020-09-15: 5 mg via ORAL
  Filled 2020-09-14: qty 1

## 2020-09-14 NOTE — ED Provider Notes (Signed)
[facility]-EMERGENCY DEPT Provider Note   CSN: [id number] Arrival date & time: 09/14/20  1942     History Chief Complaint  Patient presents with  . ivc    Lawrence Carlson is a 65 y.o. male.  HPI   Patient with significant medical history of traumatic brain injury, DVT, PE, currently on Coumadin presents to the emergency department in police custody under IVC.  Patient endorses that he does not know why he is here, he has no complaints at this time, he denies hallucinations or delusions, denies suicidal or homicidal ideations, denies illicit drug use.  Patient has no complaints at this time.Patient denies headaches, fevers, chills, shortness of breath, chest pain, abdominal pain, nausea, vomiting, diarrhea, worsening pedal edema.    Reviewed patient's IVC paperwork it appears that family members were concerned about patient's erratic behavior, he has become violent towards them, has made threatening remarks, has become easily more agitated and is noncompliant with his medications  Past Medical History:  Diagnosis Date  . Depression   . DVT (deep vein thrombosis) in pregnancy   . Paralyzed vocal cords   . Pulmonary embolism [facility])     Patient Active Problem List   Diagnosis Date Noted  . Lung nodule 08/03/2017  . Chronic gout 01/10/2016  . Anxiety 07/07/2015  . Insomnia 07/07/2015  . Anticoagulated on Coumadin 10/15/2012  . 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 Personal history of DVT (deep vein thrombosis) 10/15/2012  . Enlarged heart 08/06/2012  . Hyperlipidemia 08/06/2012  . Pulmonary embolism (HCC) 08/06/2012  . Dysphonia 07/18/2012  . Sore neck 03/22/2012  . Hx pulmonary embolism 11/20/2011  . Paralysis of vocal cords and larynx, unilateral 05/31/2011    Past Surgical History:  Procedure Laterality Date  . APPENDECTOMY    . THROAT SURGERY         No family history on file.  Social History   Tobacco Use  .  Smoking status: Never Smoker  . Smokeless tobacco: Never Used  Substance Use Topics  . Alcohol use: No  . Drug use: No    Home Medications Prior to Admission medications   Medication Sig Start Date End Date Taking? Authorizing Provider  azelastine (ASTELIN) 0.1 % nasal spray  08/01/12   [provider]  Calcium-Magnesium-Vitamin D (CALCIUM MAGNESIUM PO) Take 1 tablet by mouth daily.    [provider]  clonazePAM (KLONOPIN) 0.5 MG tablet Take 0.5 mg by mouth 2 (two) times daily as needed (to relax vocal cords).    [provider]  escitalopram (LEXAPRO) 10 MG tablet Take 10 mg by mouth at bedtime.    [provider]  Multiple Vitamin (MULTIVITAMIN WITH MINERALS) TABS tablet Take 1 tablet by mouth daily.    [provider]  Multiple Vitamin (MULTIVITAMIN) tablet Take by mouth.    [provider]  vitamin B-12 (CYANOCOBALAMIN) 1000 MCG tablet Take 5,000 mcg by mouth daily.    [provider]  zolpidem (AMBIEN) 5 MG tablet Take 5 mg by mouth at bedtime.    [provider]    Allergies    Patient has no known allergies.  Review of Systems   Review of Systems  Constitutional: Negative for chills and fever.  HENT: Negative for congestion.   Respiratory: Negative for shortness of breath.   Cardiovascular: Negative for chest pain.  Gastrointestinal: Negative for abdominal pain.  Genitourinary: Negative for enuresis.  Musculoskeletal: Negative for back pain.  Skin: Negative for  rash.  Neurological: Negative for dizziness.  Hematological: Does not bruise/bleed easily.  Psychiatric/Behavioral: Positive for agitation. Negative for hallucinations, self-injury and suicidal ideas. The patient is not nervous/anxious.     Physical Exam Updated Vital Signs BP (!) 154/94 (BP Location: Left Arm)   Pulse 72   Temp 98.5 F (36.9 C) (Oral)   Resp 18   Ht 5\' 11"  (1.803 m)   Wt 85.3 kg   SpO2 100%   BMI 26.22 kg/m    Physical Exam Vitals and nursing note reviewed.  Constitutional:      General: He is not in acute distress.    Appearance: He is not ill-appearing.  HENT:     Head: Normocephalic and atraumatic.     Nose: No congestion.  Eyes:     Extraocular Movements: Extraocular movements intact.     Conjunctiva/sclera: Conjunctivae normal.     Pupils: Pupils are equal, round, and reactive to light.  Cardiovascular:     Rate and Rhythm: Normal rate and regular rhythm.     Pulses: Normal pulses.     Heart sounds: No murmur heard. No friction rub. No gallop.   Pulmonary:     Effort: No respiratory distress.     Breath sounds: No wheezing, rhonchi or rales.  Abdominal:     Palpations: Abdomen is soft.     Tenderness: There is no abdominal tenderness.  Musculoskeletal:     Cervical back: No rigidity.     Right lower leg: No edema.     Left lower leg: No edema.     Comments: Patient is moving all 4 extremities  Skin:    General: Skin is warm and dry.  Neurological:     Mental Status: He is alert.     GCS: GCS eye subscore is 4. GCS verbal subscore is 5. GCS motor subscore is 6.     Cranial Nerves: No facial asymmetry.     Coordination: Romberg sign negative. Finger-Nose-Finger Test normal.     Comments: Cranial nerves II through XII are grossly intact  Patient have no difficulty with word finding.  Psychiatric:        Mood and Affect: Mood normal.     ED Results / Procedures / Treatments   Labs (all labs ordered are listed, but only abnormal results are displayed) Labs Reviewed  COMPREHENSIVE METABOLIC PANEL - Abnormal; Notable for the following components:      Result Value   Glucose, Bld 123 (*)    BUN 27 (*)    All other components within normal limits  SALICYLATE LEVEL - Abnormal; Notable for the following components:   Salicylate Lvl <7.0 (*)    All other components within normal limits  ACETAMINOPHEN LEVEL - Abnormal; Notable for the following components:    Acetaminophen (Tylenol), Serum <10 (*)    All other components within normal limits  RESP PANEL BY RT-PCR (FLU A&B, COVID) ARPGX2  ETHANOL  CBC WITH DIFFERENTIAL/PLATELET  URINALYSIS, ROUTINE W REFLEX MICROSCOPIC  RAPID URINE DRUG SCREEN, HOSP PERFORMED    EKG None  Radiology No results found.  Procedures Procedures   Medications Ordered in ED Medications  zolpidem (AMBIEN) tablet 5 mg (has no administration in time range)  escitalopram (LEXAPRO) tablet 10 mg (has no administration in time range)    ED Course  I have reviewed the triage vital signs and the nursing notes.  Pertinent labs & imaging results that were available during my care of the patient were reviewed by me  and considered in my medical decision making (see chart for details).    MDM Rules/Calculators/A&P                          Initial impression-patient presents under IVC due to bizarre behavior.  He is alert, does not appear to be in in acute distress, vital signs reassuring.  Will obtain med clearance work-up and consult TTS for further recommendations  Work-up-CBC is unremarkable, CMP shows hyperglycemia of 123, elevated BUN of 27.  Ethanol is less than 10, salicylate level less than 7, acetaminophen less than 10, UA negative for signs infection or hematuria.  EKG is sinus without signs of ischemia.   Consult TTS consult pending this time  Rule out-low suspicion for systemic infection as patient is nontoxic-appearing, vital signs reassuring, no evidence of infection on my exam.  Low suspicion for ACS as patient has chest pain, shortness of breath, EKG sinus without signs of ischemia.  Low suspicion for intra-abdominal abnormality as patient denies abdominal pain, abdomen soft nontender to palpation. low Suspicion for CVA or intracranial head bleed as there is no neurodeficits on my exam.  Plan-patient is medically cleared this time, will place in psych hold, TTS consult pending, patient's IV seed, home  meds have been ordered.    Final Clinical Impression(s) / ED Diagnoses Final diagnoses:  Bizarre behavior    Rx / DC Orders ED Discharge Orders    None       Carroll Sage, PA-C 09/14/20 2155    Charlynne Pander, MD 09/14/20 2329

## 2020-09-14 NOTE — ED Notes (Signed)
Pt belongings collected and placed at nurses station. Pt has 1 pair sunglasses, 1 phone, 1 pair sneakers, 1 belt, 1 pair of pants and one shirt.

## 2020-09-14 NOTE — ED Notes (Signed)
Call from [facility] police who request that son and son in law be contacted prior to this pt's release d/t the pt making threats to harm them. Mr Haque was reported to have made threats to kill both the son and son in law and has the means to carry out his threats.  [employer] will make plans to arrange for their safety.

## 2020-09-14 NOTE — ED Notes (Signed)
Pt wanded and escorted by security to hall c

## 2020-09-14 NOTE — ED Notes (Signed)
[employer] stated to call Son or son in law after Discharge. Viviann Spare - Son # [phone],  Link Snuffer - Son in Social worker # [phone]

## 2020-09-14 NOTE — ED Triage Notes (Signed)
65 yo male bib [employer], from [employer] [place] [address]. Pt was IVC'd by family for bizarre behavior per [employer].

## 2020-09-14 NOTE — ED Notes (Signed)
Pt belongings bag placed in patient belongings area near drink machine in triage.

## 2020-09-15 DIAGNOSIS — R4689 Other symptoms and signs involving appearance and behavior: Secondary | ICD-10-CM | POA: Diagnosis present

## 2020-09-15 DIAGNOSIS — F39 Unspecified mood [affective] disorder: Secondary | ICD-10-CM

## 2020-09-15 HISTORY — DX: Other symptoms and signs involving appearance and behavior: R46.89

## 2020-09-15 MED ORDER — CLONAZEPAM 0.5 MG PO TABS: 0.5000 mg | ORAL_TABLET | Freq: Two times a day (BID) | ORAL | Status: DC | PRN

## 2020-09-15 MED ORDER — OLANZAPINE 5 MG PO TABS: 5.0000 mg | ORAL_TABLET | Freq: Three times a day (TID) | ORAL | Status: DC | PRN

## 2020-09-15 MED ORDER — VITAMIN B-12 1000 MCG PO TABS
2000.0000 ug | ORAL_TABLET | Freq: Every day | ORAL | Status: DC
  Administered 2020-09-16: 2000 ug via ORAL
  Filled 2020-09-15: qty 2

## 2020-09-15 MED ORDER — ESCITALOPRAM OXALATE 10 MG PO TABS
20.0000 mg | ORAL_TABLET | Freq: Every day | ORAL | Status: DC
  Administered 2020-09-15 (×2): 20 mg via ORAL
  Filled 2020-09-15 (×2): qty 2

## 2020-09-15 MED ORDER — ALPRAZOLAM 1 MG PO TABS
1.0000 mg | ORAL_TABLET | Freq: Every day | ORAL | Status: DC
  Administered 2020-09-15 (×2): 1 mg via ORAL
  Filled 2020-09-15 (×2): qty 1

## 2020-09-15 MED ORDER — ADULT MULTIVITAMIN W/MINERALS CH
1.0000 | ORAL_TABLET | Freq: Every day | ORAL | Status: DC
  Administered 2020-09-15 – 2020-09-16 (×2): 1 via ORAL
  Filled 2020-09-15 (×2): qty 1

## 2020-09-15 NOTE — BH Assessment (Signed)
Disposition pending IVC paperwork.

## 2020-09-15 NOTE — BH Assessment (Signed)
[facility] Assessment Progress Note  Per Vernard Gambles, NP, this pt is to remain at [facility] for another night for further observation and stabilization.  Pt presents under IVC initiated by pt's son-in-law and upheld by EDP Chaney Malling, MD.  EDP Norman Clay, MD and pt's nurse, [facility], have been notified.  Doylene Canning, [place] [facility] Coordinator [phone]

## 2020-09-15 NOTE — ED Notes (Signed)
Pt has been cooperative this shift. No aggression or agitation. Pt upset about situation, very angry with family.  Pt hyper verbal at times. Hyper religious.  Delusion, calling family and telling them he is the annointed one.

## 2020-09-15 NOTE — BH Assessment (Signed)
Comprehensive Clinical Assessment (CCA) Note  09/15/2020 Lawrence Carlson [id number]   Nira Conn, NP, disposition pending IVC paperwork.  The patient demonstrates the following risk factors for suicide: Chronic risk factors for suicide include: N/A. Acute risk factors for suicide include: family or marital conflict. Protective factors for this patient include: positive social support, responsibility to others (children, family), coping skills, hope for the future and life satisfaction. Considering these factors, the overall suicide risk at this point appears to be high. Patient is not appropriate for outpatient follow up.  Flowsheet Row ED from 09/14/2020 in [facility]  C-SSRS RISK CATEGORY No Risk     Flowsheet Row ED from 09/14/2020 in [facility]  C-SSRS RISK CATEGORY No Risk    1:1 recommended  Lawrence Carlson is a 65 year old male presenting under IVC to [facility] due to HI and aggressive behaviors toward family members. Patient was [place] [address] when police approached him and brought him in to [facility]. Patient reported that he does not know why he is here. Patient denied SI, HI and psychosis. Patient denied alcohol/drug usage.   PER IVC noted in chart, family members were concerned about patients erratic behavior, he has become violent towards them, has made threatening remarks, has become easily more agitated and is noncompliance with his medications.   Chief Complaint:  Chief Complaint  Patient presents with  . ivc   Visit Diagnosis: Major depressive disorder  CCA Biopsychosocial Intake/Chief Complaint:  Per IVC, HI  Current Symptoms/Problems: Per IVC, HI  Patient Reported Schizophrenia/Schizoaffective Diagnosis in Past: No data recorded  Strengths: uta  Preferences: spirituality  Abilities: uta  Type of Services Patient Feels are Needed: none  Initial Clinical Notes/Concerns: No data recorded  Mental Health  Symptoms Depression:  None   Duration of Depressive symptoms: No data recorded  Mania:  None   Anxiety:   None   Psychosis:  None   Duration of Psychotic symptoms: No data recorded  Trauma:  None   Obsessions:  None   Compulsions:  None   Inattention:  None   Hyperactivity/Impulsivity:  N/A   Oppositional/Defiant Behaviors:  None   Emotional Irregularity:  None   Other Mood/Personality Symptoms:  No data recorded   Mental Status Exam Appearance and self-care  Stature:  Average   Weight:  Average weight   Clothing:  Neat/clean   Grooming:  Normal   Cosmetic use:  None   Posture/gait:  Normal   Motor activity:  Not Remarkable   Sensorium  Attention:  Normal   Concentration:  Normal   Orientation:  X5   Recall/memory:  Normal   Affect and Mood  Affect:  Appropriate; Anxious   Mood:  Anxious   Relating  Eye contact:  Normal   Facial expression:  Anxious   Attitude toward examiner:  Cooperative   Thought and Language  Speech flow: Normal   Thought content:  Appropriate to Mood and Circumstances   Preoccupation:  Religion   Hallucinations:  None   Organization:  No data recorded  [employer] of Knowledge:  Average   Intelligence:  Average   Abstraction:  Normal   Judgement:  Poor   Reality Testing:  No data recorded  Insight:  Fair   Decision Making:  Impulsive   Social Functioning  Social Maturity:  Impulsive   Social Judgement:  Normal   Stress  Stressors:  Family conflict; Relationship   Coping Ability:  Exhausted  Skill Deficits:  Self-control   Supports:  Family    Religion: Religion/Spirituality Are You A Religious Person?: Yes  Leisure/Recreation: Leisure / Recreation Do You Have Hobbies?: Yes Leisure and Hobbies: spend time with grandchildren  Exercise/Diet: Exercise/Diet Do You Have Any Trouble Sleeping?: No  CCA Employment/Education Employment/Work Situation: Employment / Work  Situation Employment situation: Retired Has patient ever been in the [employer]?: No  Education: Education Is Patient Currently Attending School?: No Last Grade Completed: 14 Did You Product manager?: Yes What Type of College Degree Do you Have?: Associates Degree Did You Attend Graduate School?: No Did You Have An Individualized Education Program (IIEP): No Did You Have Any Difficulty At School?: No Patient's Education Has Been Impacted by Current Illness: No  CCA Family/Childhood History Family and Relationship History: Family history Marital status: Married Number of Years Married: 42 Does patient have children?: Yes How many children?: 2 How is patient's relationship with their children?: good  Childhood History:  Childhood History Description of patient's relationship with caregiver when they were a child: uta Patient's description of current relationship with people who raised him/her: uta How were you disciplined when you got in trouble as a child/adolescent?: uta Did patient suffer any verbal/emotional/physical/sexual abuse as a child?: No Did patient suffer from severe childhood neglect?: No Was the patient ever a victim of a crime or a disaster?: No Has patient been affected by domestic violence as an adult?: No  Child/Adolescent Assessment:  CCA Substance Use Alcohol/Drug Use: Alcohol / Drug Use Pain Medications: see MAR Prescriptions: see MAR Over the Counter: see MAR History of alcohol / drug use?: No history of alcohol / drug abuse   ASAM's:  Six Dimensions of Multidimensional Assessment  Dimension 1:  Acute Intoxication and/or Withdrawal Potential:      Dimension 2:  Biomedical Conditions and Complications:      Dimension 3:  Emotional, Behavioral, or Cognitive Conditions and Complications:     Dimension 4:  Readiness to Change:     Dimension 5:  Relapse, Continued use, or Continued Problem Potential:     Dimension 6:  Recovery/Living Environment:      ASAM Severity Score:    ASAM Recommended Level of Treatment:     Substance use Disorder (SUD)   Recommendations for Services/Supports/Treatments: Recommendations for Services/Supports/Treatments Recommendations For Services/Supports/Treatments: Medication Management,Individual Therapy  DSM5 Diagnoses: Patient Active Problem List   Diagnosis Date Noted  . Lung nodule 08/03/2017  . Chronic gout 01/10/2016  . Anxiety 07/07/2015  . Insomnia 07/07/2015  . Anticoagulated on Coumadin 10/15/2012  . 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 Personal history of DVT (deep vein thrombosis) 10/15/2012  . Enlarged heart 08/06/2012  . Hyperlipidemia 08/06/2012  . Pulmonary embolism (HCC) 08/06/2012  . Dysphonia 07/18/2012  . Sore neck 03/22/2012  . Hx pulmonary embolism 11/20/2011  . 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

## 2020-09-15 NOTE — ED Notes (Signed)
Pt alert. Cooperative. Asking to talk to provider and receive update about care plan.
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
## 2020-09-16 ENCOUNTER — Encounter (HOSPITAL_COMMUNITY): Payer: Self-pay | Admitting: Radiology

## 2020-09-16 ENCOUNTER — Emergency Department (HOSPITAL_COMMUNITY): Payer: Medicare HMO

## 2020-09-16 DIAGNOSIS — R4689 Other symptoms and signs involving appearance and behavior: Secondary | ICD-10-CM

## 2020-09-16 NOTE — Discharge Instructions (Signed)
For your behavioral health needs you are advised to follow up with your primary care provider for the time being.  To schedule an intake appointment with a psychiatrist contact one of the providers listed below at your earliest opportunity:       Archer Asa, MD      [facility]      [address]      [place], [place] [address]      [phone]       Neila Gear, MD      [facility]      [address]      [place], [place] [address]      [phone]

## 2020-09-16 NOTE — ED Provider Notes (Signed)
Patient cleared for discharge by psychiatry.  Head CT was negative here   Lorre Nick, MD 09/16/20 1455
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
## 2020-09-16 NOTE — ED Provider Notes (Signed)
Emergency Medicine Observation Re-evaluation Note  Lawrence Carlson is a 65 y.o. male, seen on rounds today.  Pt initially presented to the ED for complaints of ivc Currently, the patient is sitting in bed.  Physical Exam  BP (!) 133/95 (BP Location: Right Arm)   Pulse 78   Temp 97.9 F (36.6 C) (Oral)   Resp 15   Ht 1.803 m (5\' 11" )   Wt 85.3 kg   SpO2 97%   BMI 26.22 kg/m  Physical Exam General: Cooperative  Psych: No acute distress  ED Course / MDM  EKG:EKG Interpretation  Date/Time:  Tuesday September 14 2020 19:58:17 EDT Ventricular Rate:  88 PR Interval:  139 QRS Duration: 86 QT Interval:  379 QTC Calculation: 459 R Axis:   77 Text Interpretation: Sinus rhythm Borderline low voltage, extremity leads Otherwise within normal limits When compared with ECG of 03/11/2017, HEART RATE has increased Confirmed by 03/13/2017 (Dione Booze) on 09/15/2020 4:43:24 AM   I have reviewed the labs performed to date as well as medications administered while in observation.  Recent changes in the last 24 hours include none.  Plan  Current plan is for placement. Patient is under full IVC at this time.   09/17/2020, MD 09/16/20 [phone]

## 2020-09-27 ENCOUNTER — Ambulatory Visit: Payer: Medicare HMO | Admitting: Behavioral Health

## 2020-10-07 ENCOUNTER — Encounter: Payer: Self-pay | Admitting: Behavioral Health

## 2020-10-07 ENCOUNTER — Ambulatory Visit (INDEPENDENT_AMBULATORY_CARE_PROVIDER_SITE_OTHER): Payer: Medicare HMO | Admitting: Behavioral Health

## 2020-10-07 VITALS — BP 135/86 | HR 77 | Ht 71.0 in | Wt 191.0 lb

## 2020-10-07 DIAGNOSIS — F309 Manic episode, unspecified: Secondary | ICD-10-CM | POA: Diagnosis not present

## 2020-10-07 DIAGNOSIS — F0630 Mood disorder due to known physiological condition, unspecified: Secondary | ICD-10-CM

## 2020-10-07 MED ORDER — DIVALPROEX SODIUM ER 500 MG PO TB24: 500.0000 mg | ORAL_TABLET | Freq: Two times a day (BID) | ORAL | 0 refills | Status: DC
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
## 2020-10-13 ENCOUNTER — Telehealth: Payer: Self-pay | Admitting: Behavioral Health

## 2020-10-13 NOTE — Telephone Encounter (Signed)
Lawrence Carlson called in saying that his PCP at [facility] needs referral infor from [place] for him to MRI of brain. They would like our notes faxed over to them for this referral. Did you recommend an MRI? If so, there is nothing in the notes stating this and might need to be there for the referral.

## 2020-10-14 ENCOUNTER — Telehealth: Payer: Self-pay | Admitting: Behavioral Health

## 2020-10-14 ENCOUNTER — Telehealth: Payer: Medicare HMO | Admitting: Behavioral Health

## 2020-10-14 NOTE — Telephone Encounter (Signed)
Attempted to call patient at 2:03 pm. Repeated attempt at 2:15 pm and was unable to make contact for scheduled telephone visit today. Patient had also agreed to come by office today to sign release for records to be sent to PCP.

## 2020-10-14 NOTE — Telephone Encounter (Signed)
Per pt's PCP, [facility], the MD there is ordering the MRI. They would like to have our notes to send to help get the PA for the MRI. I did tell them that these notes will probably not be very helpful, but we will have that patient come by and sign a release. Then it is ok to release his notes to the PCP.

## 2020-10-14 NOTE — Telephone Encounter (Signed)
Ok thanks for letting me know

## 2020-10-19 ENCOUNTER — Encounter: Payer: Self-pay | Admitting: Behavioral Health

## 2020-10-19 ENCOUNTER — Telehealth (INDEPENDENT_AMBULATORY_CARE_PROVIDER_SITE_OTHER): Payer: Medicare HMO | Admitting: Behavioral Health

## 2020-10-19 DIAGNOSIS — Z79899 Other long term (current) drug therapy: Secondary | ICD-10-CM

## 2020-10-19 DIAGNOSIS — F0630 Mood disorder due to known physiological condition, unspecified: Secondary | ICD-10-CM

## 2020-10-19 DIAGNOSIS — F309 Manic episode, unspecified: Secondary | ICD-10-CM

## 2020-10-19 MED ORDER — DIVALPROEX SODIUM 500 MG PO DR TAB: 500.0000 mg | DELAYED_RELEASE_TABLET | Freq: Three times a day (TID) | ORAL | 1 refills | Status: DC
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
## 2020-10-27 ENCOUNTER — Telehealth: Payer: Self-pay | Admitting: Behavioral Health

## 2020-10-27 NOTE — Telephone Encounter (Signed)
Ok. I didn't realize, I just knew he didn't have an appt. Is there a timing or fasting requirement for that lab?

## 2020-10-27 NOTE — Telephone Encounter (Signed)
Pt said he will go tomorrow. He went today but they closed at 2pm. He will not forget.
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
## 2020-10-27 NOTE — Telephone Encounter (Signed)
Please inform him that I can not see him until he has completed his lab draw for depakote. He agreed to go to [facility] for draw. There is no record of him doing that. I cannot further assist him until this lab is conducted.

## 2020-10-27 NOTE — Telephone Encounter (Signed)
Acknowledged, Thank you.

## 2020-11-02 ENCOUNTER — Telehealth: Payer: Medicare HMO | Admitting: Behavioral Health

## 2020-11-02 NOTE — Progress Notes (Unsigned)
Patient called in and said he had family emergency and could not make today's telephonic visit. He will be advised by staff that we can not schedule another appointment until he is compliant with Depakote levels. Patient was scheduled for lab draw at [facility] on 10/19/2020. He was advised that this was necessary for further treatment and safety of medication. We cannot safely and effectively treat patient for severe mania if not compliant.

## 2020-11-05 LAB — VALPROIC ACID LEVEL: Valproic Acid Lvl: 85.2 mg/L (ref 50.0–100.0)

## 2020-11-11 ENCOUNTER — Encounter: Payer: Self-pay | Admitting: Behavioral Health

## 2020-11-11 ENCOUNTER — Other Ambulatory Visit: Payer: Self-pay

## 2020-11-11 ENCOUNTER — Telehealth (INDEPENDENT_AMBULATORY_CARE_PROVIDER_SITE_OTHER): Payer: Medicare HMO | Admitting: Behavioral Health

## 2020-11-11 DIAGNOSIS — F309 Manic episode, unspecified: Secondary | ICD-10-CM | POA: Diagnosis not present

## 2020-11-11 DIAGNOSIS — F0630 Mood disorder due to known physiological condition, unspecified: Secondary | ICD-10-CM | POA: Diagnosis not present

## 2020-11-11 MED ORDER — OLANZAPINE 5 MG PO TABS: 5.0000 mg | ORAL_TABLET | Freq: Every day | ORAL | 1 refills | Status: DC

## 2020-11-11 MED ORDER — DIVALPROEX SODIUM 500 MG PO DR TAB: DELAYED_RELEASE_TABLET | ORAL | 1 refills | Status: DC

## 2020-11-11 NOTE — Progress Notes (Signed)
[name] [id number] 02-Nov-1955 65 y.o.  Virtual Visit via Telephone Note  I connected with pt on 11/11/20 at 10:30 AM EDT by telephone and verified that I am speaking with the correct person using two identifiers.   I discussed the limitations, risks, security and privacy concerns of performing an evaluation and management service by telephone and the availability of in person appointments. I also discussed with the patient that there may be a patient responsible charge related to this service. The patient expressed understanding and agreed to proceed.   I discussed the assessment and treatment plan with the patient. The patient was provided an opportunity to ask questions and all were answered. The patient agreed with the plan and demonstrated an understanding of the instructions.   The patient was advised to call back or seek an in-person evaluation if the symptoms worsen or if the condition fails to improve as anticipated.  I provided 30  minutes of non-face-to-face time during this encounter.  The patient was located at home.  The provider was located at [facility].   Elwanda Brooklyn, NP   Subjective:   Patient ID:  Lawrence Carlson is a 65 y.o. (DOB 1955-09-10) male.  Chief Complaint:  Chief Complaint  Patient presents with   Manic Behavior   Medication Problem   Medication Refill   Patient Education   Follow-up    HPI Lawrence Carlson presents for follow-up via telephone for review and medication management. He says that, "Im doing great...feeling great. Never felt better in my life. The medication you gave me is doing wonders. I have my mind back. I don't let my wife and my children run all over me and take advantage of me. I bet you have never met anyone like me have you". He says that his family like the old Lawrence Carlson, one they can take advantage of. He says, "Im not that person anymore".  He says that his depression is 0/10 and his anxiety 0/10. Reports sleeping well with  his medications and gets 7 hours per night. He denies mania. Reports no delusions, auditory or visual hallucinations. Denies SI/HI.  Past Psychiatric medication failures: Lexapro 20 mg increased severe manic state  Review of Systems:  Review of Systems  Medications: I have reviewed the patient's current medications.  Current Outpatient Medications  Medication Sig Dispense Refill   ALPRAZolam (XANAX) 0.5 MG tablet Take 0.5 mg by mouth 2 (two) times daily as needed for sleep.     Cholecalciferol (VITAMIN D) 50 MCG (2000 UT) tablet Take 2,000 Units by mouth daily.     Coenzyme Q10 (CO Q10) 200 MG CAPS Take 200 mg by mouth daily.     divalproex (DEPAKOTE) 500 MG DR tablet Take 1 tablet (500 mg) with breakfast, one tablet after lunch (500 mg) and two tablets at bedtime (1000 mg). 120 tablet 1   Flaxseed, Linseed, (FLAX SEED OIL) 1000 MG CAPS Take 1,000 mg by mouth daily.     Magnesium 250 MG TABS Take 250 mg by mouth daily.     Misc Natural Products (SUPER GREENS) POWD Take 1 Scoop by mouth daily. Mix with shake daily ( Green blend super food)     Multiple Vitamin (MULTIVITAMIN WITH MINERALS) TABS tablet Take 1 tablet by mouth daily.     OLANZapine (ZYPREXA) 5 MG tablet Take 1 tablet (5 mg total) by mouth at bedtime. 30 tablet 1   omeprazole (PRILOSEC) 40 MG capsule Take 40 mg by mouth daily.  traZODone (DESYREL) 50 MG tablet Take 50 mg by mouth at bedtime as needed for sleep (do not take on same night as ambien).     vitamin B-12 (CYANOCOBALAMIN) 1000 MCG tablet Take 5,000 mcg by mouth daily.     zolpidem (AMBIEN) 5 MG tablet Take 5 mg by mouth at bedtime as needed for sleep.     escitalopram (LEXAPRO) 10 MG tablet Take 10 mg by mouth at bedtime. (Patient not taking: Reported on 11/11/2020)     meloxicam (MOBIC) 15 MG tablet Take 15 mg by mouth daily.     OVER THE COUNTER MEDICATION Take 237 mLs by mouth daily. Gochi drink     No current facility-administered medications for this visit.     Medication Side Effects: Other: Lexapro: mania  Allergies: No Known Allergies  Past Medical History:  Diagnosis Date   Depression    DVT (deep vein thrombosis) in pregnancy    Paralyzed vocal cords    Pulmonary embolism (HCC)     Family History  Problem Relation Age of Onset   Alcohol abuse Mother    Alcohol abuse Father     Social History   Socioeconomic History   Marital status: Married    Spouse name: Not on file   Number of children: 2   Years of education: 14   Highest education level: Not on file  Occupational History   Occupation: Press photographer    Comment: self-employed  Tobacco Use   Smoking status: Never   Smokeless tobacco: Never  Substance and Sexual Activity   Alcohol use: Never   Drug use: No   Sexual activity: Not Currently    Comment: reports no sex in 35 years  Other Topics Concern   Not on file  Social History Narrative   Lives at home with wife in [place]. Likes to play golf, run, and play golf.   Social Determinants of Health   Financial Resource Strain: Not on file  Food Insecurity: Not on file  Transportation Needs: Not on file  Physical Activity: Not on file  Stress: Not on file  Social Connections: Not on file  Intimate Partner Violence: Not on file    Past Medical History, Surgical history, Social history, and Family history were reviewed and updated as appropriate.   Please see review of systems for further details on the patient's review from today.   Objective:   Physical Exam:  There were no vitals taken for this visit.  Physical Exam  Lab Review:     Component Value Date/Time   NA 138 09/14/2020 2033   K 4.1 09/14/2020 2033   CL 105 09/14/2020 2033   CO2 25 09/14/2020 2033   GLUCOSE 123 (H) 09/14/2020 2033   BUN 27 (H) 09/14/2020 2033   CREATININE 0.98 09/14/2020 2033   CALCIUM 9.2 09/14/2020 2033   PROT 7.1 09/14/2020 2033   ALBUMIN 4.1 09/14/2020 2033   AST 37 09/14/2020 2033   ALT 34 09/14/2020 2033    ALKPHOS 54 09/14/2020 2033   BILITOT 0.6 09/14/2020 2033   GFRNONAA >60 09/14/2020 2033   GFRAA >60 03/11/2017 1942       Component Value Date/Time   WBC 5.6 09/14/2020 2033   RBC 4.35 09/14/2020 2033   HGB 14.1 09/14/2020 2033   HGB 15.1 11/01/2009 1525   HCT 41.7 09/14/2020 2033   HCT 44.1 11/01/2009 1525   PLT 184 09/14/2020 2033   PLT 210 11/01/2009 1525   MCV 95.9 09/14/2020 2033  MCV 95 11/01/2009 1525   MCH 32.4 09/14/2020 2033   MCHC 33.8 09/14/2020 2033   RDW 13.4 09/14/2020 2033   RDW 11.2 11/01/2009 1525   LYMPHSABS 1.1 09/14/2020 2033   LYMPHSABS 1.3 11/01/2009 1525   MONOABS 0.6 09/14/2020 2033   EOSABS 0.1 09/14/2020 2033   EOSABS 0.1 11/01/2009 1525   BASOSABS 0.0 09/14/2020 2033   BASOSABS 0.0 11/01/2009 1525    No results found for: POCLITH, LITHIUM   Lab Results  Component Value Date   VALPROATE 85.2 11/04/2020     .res Assessment: Plan:    Daksh was seen today for manic behavior, medication problem, medication refill, patient education and follow-up.  Diagnoses and all orders for this visit:  Mood disorder in conditions classified elsewhere -     divalproex (DEPAKOTE) 500 MG DR tablet; Take 1 tablet (500 mg) with breakfast, one tablet after lunch (500 mg) and two tablets at bedtime (1000 mg). -     OLANZapine (ZYPREXA) 5 MG tablet; Take 1 tablet (5 mg total) by mouth at bedtime.  Mania (HCC) -     divalproex (DEPAKOTE) 500 MG DR tablet; Take 1 tablet (500 mg) with breakfast, one tablet after lunch (500 mg) and two tablets at bedtime (1000 mg). -     OLANZapine (ZYPREXA) 5 MG tablet; Take 1 tablet (5 mg total) by mouth at bedtime.     Treatment Plan/Recommendations:  To increase Depakote ER to 500 mg after breakfast, 500 mg after lunch, and 1000 mg at bedtime.  Start Zyprexa 5 mg at bedtime. Will report worsening symptoms or side effects promptly. To follow up in 4  weeks to reevaluate manic state and order new labs Recommended  psychotherapy/ DBT group therapy. Provided emergency contact numbers.  Greater than 50% of  30 min telephonic interview time with patient was spent on counseling and coordination of care.  With agreement with the patient, the dose was raised another 500 mg daily for total of 2000 mg daily. Initiated olanzapine 5 mg due to limited response from Depakote alone. Patient is still exhibiting moderate to severe levels of mania. Patient had therapeutic level of Depakote and will continue to steadily increase till in the plus 90-100 range for effective treatment of severe symptoms.   Please see After Visit Summary for patient specific instructions.  No future appointments.  No orders of the defined types were placed in this encounter.     -------------------------------

## 2020-12-09 ENCOUNTER — Ambulatory Visit (INDEPENDENT_AMBULATORY_CARE_PROVIDER_SITE_OTHER): Payer: Medicare HMO | Admitting: Behavioral Health

## 2020-12-09 ENCOUNTER — Encounter: Payer: Self-pay | Admitting: Behavioral Health

## 2020-12-09 ENCOUNTER — Other Ambulatory Visit: Payer: Self-pay

## 2020-12-09 DIAGNOSIS — Z79899 Other long term (current) drug therapy: Secondary | ICD-10-CM | POA: Diagnosis not present

## 2020-12-09 DIAGNOSIS — F0630 Mood disorder due to known physiological condition, unspecified: Secondary | ICD-10-CM | POA: Diagnosis not present

## 2020-12-09 DIAGNOSIS — F309 Manic episode, unspecified: Secondary | ICD-10-CM | POA: Diagnosis not present

## 2020-12-09 DIAGNOSIS — Z9114 Patient's other noncompliance with medication regimen: Secondary | ICD-10-CM | POA: Diagnosis not present

## 2020-12-09 NOTE — Progress Notes (Signed)
[facility]  Patient ID: Lawrence Carlson,  MRN: [id number]  PCP: Titus Dubin., MD  Date of Evaluation: 12/09/2020 Time spent:30 minutes  Chief Complaint:  Chief Complaint   Family Problem; Follow-up; Medication Problem; Patient Education; Manic Behavior     HISTORY/CURRENT STATUS: HPI 65 year old male presents to this office for follow up and medication management. He says, " I am a new person, this is the new Gloria, can you believe it". He says the medication changed his life and "opened his eyes to the truth". He says that it may have change it a little too much because he has been a little slower than he has been his whole life. Says, "but I can honestly say, I have never been tired one day in my life".  He says that he is still taking the Depakote 1500 mg daily, but he did not take the Zyprexa. Said he spoke to his pharmacist. York Spaniel he has made up his mind if he wants to take it and needs to pray about it. Also he says he wants to consider reducing the Depakote because it slowing him down. He reports anxiety today at 0/10. Reports depression at 0/10. Says he "sleeps like a baby and ready to tackle the world next day".  He denies auditory or visual hallucinations. Says he is not SI/HI.  Says his family is still against him and his wife says he acts like 89 year old. Says he has considered packing up and leaving everyone behind because they do not appreciate him.   Past Psychiatric medication failures: Lexapro 20 mg increases severe manic state  Individual Medical History/ Review of Systems: Changes? :No   Allergies: Patient has no known allergies.  Current Medications:  Current Outpatient Medications:    ALPRAZolam (XANAX) 0.5 MG tablet, Take 0.5 mg by mouth 2 (two) times daily as needed for sleep., Disp: , Rfl:    Cholecalciferol (VITAMIN D) 50 MCG (2000 UT) tablet, Take 2,000 Units by mouth daily., Disp: , Rfl:    Coenzyme Q10 (CO Q10) 200 MG CAPS, Take 200 mg by  mouth daily., Disp: , Rfl:    divalproex (DEPAKOTE) 500 MG DR tablet, Take 1 tablet (500 mg) with breakfast, one tablet after lunch (500 mg) and two tablets at bedtime (1000 mg)., Disp: 120 tablet, Rfl: 1   Magnesium 250 MG TABS, Take 250 mg by mouth daily., Disp: , Rfl:    Misc Natural Products (SUPER GREENS) POWD, Take 1 Scoop by mouth daily. Mix with shake daily ( Green blend super food), Disp: , Rfl:    Multiple Vitamin (MULTIVITAMIN WITH MINERALS) TABS tablet, Take 1 tablet by mouth daily., Disp: , Rfl:    omeprazole (PRILOSEC) 40 MG capsule, Take 40 mg by mouth daily., Disp: , Rfl:    traZODone (DESYREL) 50 MG tablet, Take 50 mg by mouth at bedtime as needed for sleep (do not take on same night as ambien)., Disp: , Rfl:    vitamin B-12 (CYANOCOBALAMIN) 1000 MCG tablet, Take 5,000 mcg by mouth daily., Disp: , Rfl:    escitalopram (LEXAPRO) 10 MG tablet, Take 10 mg by mouth at bedtime. (Patient not taking: No sig reported), Disp: , Rfl:    Flaxseed, Linseed, (FLAX SEED OIL) 1000 MG CAPS, Take 1,000 mg by mouth daily. (Patient not taking: Reported on 12/09/2020), Disp: , Rfl:    meloxicam (MOBIC) 15 MG tablet, Take 15 mg by mouth daily. (Patient not taking: Reported on 12/09/2020), Disp: , Rfl:  OLANZapine (ZYPREXA) 5 MG tablet, Take 1 tablet (5 mg total) by mouth at bedtime., Disp: 30 tablet, Rfl: 1   OVER THE COUNTER MEDICATION, Take 237 mLs by mouth daily. Gochi drink, Disp: , Rfl:    zolpidem (AMBIEN) 5 MG tablet, Take 5 mg by mouth at bedtime as needed for sleep. (Patient not taking: Reported on 12/09/2020), Disp: , Rfl:  Medication Side Effects: none  Family Medical/ Social History: Changes? No  MENTAL HEALTH EXAM:  There were no vitals taken for this visit.There is no height or weight on file to calculate BMI.  General Appearance: Neat and Well Groomed  Eye Contact:  Good  Speech:  Clear and Coherent and Talkative  Volume:  Increased  Mood:  Anxious  Affect:  Inappropriate and  Anxious  Thought Process:  Disorganized and Descriptions of Associations: Tangential  Orientation:  Full (Time, Place, and Person)  Thought Content: Illogical   Suicidal Thoughts:  No  Homicidal Thoughts:  No  Memory:  WNL  Judgement:  Impaired  Insight:  Lacking  Psychomotor Activity:  Increased  Concentration:  Concentration: Fair  Recall:  Good  Fund of Knowledge: Fair  Language: Good  Assets:  Desire for Improvement  ADL's:  Impaired  Cognition: Impaired,  Moderate  Prognosis:  Poor    DIAGNOSES:    ICD-10-CM   1. Mood disorder in conditions classified elsewhere  F06.30     2. Mania (HCC)  F30.9     3. High risk medication use  Z79.899     4. Non compliance w medication regimen  Z91.14       Receiving Psychotherapy: No    RECOMMENDATIONS:   To continue Depakote ER to 500 mg after breakfast, 500 mg after lunch, and 1000 mg at bedtime. Patient has been non-compliant with Zyprexa and did not notify this office. He wants to reduce his Depakote but said he would continue for now.  Will report worsening symptoms or side effects promptly. To follow up in 6 weeks to reevaluate manic state. Recommended psychotherapy/ DBT group therapy. Provided emergency contact numbers.  Greater than 50% of  30 min interview time with patient was spent on counseling and coordination of care. We had a Candid conversation about his moderate improvement in manic behaviors. Discussed with him that he was not compliant with taking Zyprexa and that reducing Depakote at this time was not advised. I do however observe an approximate 50% improvement and decrease in mania since original visit. However, still considered severe level.  I explained that if he decided to not take medication and/or reduce Depakote that I was not comfortable in continuing his psychiatric care. Advised him that he may need a higher level of care than I can provide at this time especially if he is not compliant. He said he agreed  and understood. I asked him if he wanted a referral and he declined at this time. He said, "I want to pray about it and I will let you know next week what I decide". I told him that would be fine and we could make final decision then. Patient followed up with Neurology in June, 2022 since last visit in this office. Last Depakote Level in June, 2022 was 85.      Joan Flores, NP

## 2021-01-20 ENCOUNTER — Ambulatory Visit: Payer: Medicare HMO | Admitting: Behavioral Health

## 2021-03-22 ENCOUNTER — Encounter: Payer: Self-pay | Admitting: Behavioral Health

## 2021-03-22 ENCOUNTER — Ambulatory Visit (INDEPENDENT_AMBULATORY_CARE_PROVIDER_SITE_OTHER): Payer: Medicare HMO | Admitting: Behavioral Health

## 2021-03-22 DIAGNOSIS — F309 Manic episode, unspecified: Secondary | ICD-10-CM

## 2021-03-22 DIAGNOSIS — Z9114 Patient's other noncompliance with medication regimen: Secondary | ICD-10-CM | POA: Diagnosis not present

## 2021-03-22 DIAGNOSIS — F0630 Mood disorder due to known physiological condition, unspecified: Secondary | ICD-10-CM | POA: Diagnosis not present

## 2021-03-22 NOTE — Progress Notes (Signed)
Lawrence Carlson [id number] [birth year] 65 y.o.  Virtual Visit via Telephone Note  I connected with pt on 03/22/21 at 11:30 AM EDT by telephone and verified that I am speaking with the correct person using two identifiers.   I discussed the limitations, risks, security and privacy concerns of performing an evaluation and management service by telephone and the availability of in person appointments. I also discussed with the patient that there may be a patient responsible charge related to this service. The patient expressed understanding and agreed to proceed.   I discussed the assessment and treatment plan with the patient. The patient was provided an opportunity to ask questions and all were answered. The patient agreed with the plan and demonstrated an understanding of the instructions.   The patient was advised to call back or seek an in-person evaluation if the symptoms worsen or if the condition fails to improve as anticipated.  I provided 30 minutes of non-face-to-face time during this encounter.  The patient was located at home.  The provider was located at [facility].   Joan Flores, NP   Subjective:   Patient ID:  Lawrence Carlson is a 65 y.o. (DOB [birth year]) male.  Chief Complaint:  Chief Complaint  Patient presents with   Depression   Manic Behavior   Follow-up   Family Problem   Anxiety    HPI Lawrence Carlson presents for follow-up and medication management. He says that he is doing great and that "he had a moment with God that changed his life". Says that he is no longer taking his psychiatric medications because God has changed him. He says that he is calmer than he has ever been. He says that he has legal troubles because [employer] [place] insurance board is challenging his license and he is having to get attorney. He says that he thanks this office but would like to just come in for 6 month follow up because he is doing better than he ever has. He endorses mania,  delusional thinking. No SI/ or HI.   Past Psychiatric medication trials: Lexapro 20 mg increases severe manic state Depakote: Stopped non-compliant Zyprexa: non-compliant, not sure medication taken Zolpidem Xanax      Review of Systems:  Review of Systems  Constitutional: Negative.   Allergic/Immunologic: Negative.   Neurological: Negative.   Psychiatric/Behavioral:  The patient is nervous/anxious.    Medications: I have reviewed the patient's current medications.  Current Outpatient Medications  Medication Sig Dispense Refill   Cholecalciferol (VITAMIN D) 50 MCG (2000 UT) tablet Take 2,000 Units by mouth daily.     Magnesium 250 MG TABS Take 250 mg by mouth daily.     traZODone (DESYREL) 50 MG tablet Take 50 mg by mouth at bedtime as needed for sleep (do not take on same night as ambien).     vitamin B-12 (CYANOCOBALAMIN) 1000 MCG tablet Take 5,000 mcg by mouth daily.     ALPRAZolam (XANAX) 0.5 MG tablet Take 0.5 mg by mouth 2 (two) times daily as needed for sleep.     Coenzyme Q10 (CO Q10) 200 MG CAPS Take 200 mg by mouth daily. (Patient not taking: Reported on 03/22/2021)     Flaxseed, Linseed, (FLAX SEED OIL) 1000 MG CAPS Take 1,000 mg by mouth daily. (Patient not taking: No sig reported)     meloxicam (MOBIC) 15 MG tablet Take 15 mg by mouth daily. (Patient not taking: No sig reported)     Misc Natural Products (SUPER GREENS) POWD  Take 1 Scoop by mouth daily. Mix with shake daily ( Green blend super food) (Patient not taking: Reported on 03/22/2021)     Multiple Vitamin (MULTIVITAMIN WITH MINERALS) TABS tablet Take 1 tablet by mouth daily.     omeprazole (PRILOSEC) 40 MG capsule Take 40 mg by mouth daily. (Patient not taking: Reported on 03/22/2021)     OVER THE COUNTER MEDICATION Take 237 mLs by mouth daily. Gochi drink     No current facility-administered medications for this visit.    Medication Side Effects: None  Allergies: No Known Allergies  Past Medical  History:  Diagnosis Date   Depression    DVT (deep vein thrombosis) in pregnancy    Paralyzed vocal cords    Pulmonary embolism (HCC)     Family History  Problem Relation Age of Onset   Alcohol abuse Mother    Alcohol abuse Father     Social History   Socioeconomic History   Marital status: Married    Spouse name: Not on file   Number of children: 2   Years of education: 14   Highest education level: Not on file  Occupational History   Occupation: Airline pilot    Comment: self-employed  Tobacco Use   Smoking status: Never   Smokeless tobacco: Never  Substance and Sexual Activity   Alcohol use: Never   Drug use: No   Sexual activity: Not Currently    Comment: reports no sex in 18 years  Other Topics Concern   Not on file  Social History Narrative   Lives at home with wife in [place]. Likes to play golf, run, and play golf.   Social Determinants of Health   Financial Resource Strain: Not on file  Food Insecurity: Not on file  Transportation Needs: Not on file  Physical Activity: Not on file  Stress: Not on file  Social Connections: Not on file  Intimate Partner Violence: Not on file    Past Medical History, Surgical history, Social history, and Family history were reviewed and updated as appropriate.   Please see review of systems for further details on the patient's review from today.   Objective:   Physical Exam:  There were no vitals taken for this visit.  Physical Exam Psychiatric:        Mood and Affect: Mood is anxious and elated.        Speech: Speech is rapid and pressured.        Thought Content: Thought content is delusional.        Judgment: Judgment is impulsive.    Lab Review:     Component Value Date/Time   NA 138 09/14/2020 2033   K 4.1 09/14/2020 2033   CL 105 09/14/2020 2033   CO2 25 09/14/2020 2033   GLUCOSE 123 (H) 09/14/2020 2033   BUN 27 (H) 09/14/2020 2033   CREATININE 0.98 09/14/2020 2033   CALCIUM 9.2 09/14/2020 2033   PROT  7.1 09/14/2020 2033   ALBUMIN 4.1 09/14/2020 2033   AST 37 09/14/2020 2033   ALT 34 09/14/2020 2033   ALKPHOS 54 09/14/2020 2033   BILITOT 0.6 09/14/2020 2033   GFRNONAA >60 09/14/2020 2033   GFRAA >60 03/11/2017 1942       Component Value Date/Time   WBC 5.6 09/14/2020 2033   RBC 4.35 09/14/2020 2033   HGB 14.1 09/14/2020 2033   HGB 15.1 11/01/2009 1525   HCT 41.7 09/14/2020 2033   HCT 44.1 11/01/2009 1525   PLT 184 09/14/2020  2033   PLT 210 11/01/2009 1525   MCV 95.9 09/14/2020 2033   MCV 95 11/01/2009 1525   MCH 32.4 09/14/2020 2033   MCHC 33.8 09/14/2020 2033   RDW 13.4 09/14/2020 2033   RDW 11.2 11/01/2009 1525   LYMPHSABS 1.1 09/14/2020 2033   LYMPHSABS 1.3 11/01/2009 1525   MONOABS 0.6 09/14/2020 2033   EOSABS 0.1 09/14/2020 2033   EOSABS 0.1 11/01/2009 1525   BASOSABS 0.0 09/14/2020 2033   BASOSABS 0.0 11/01/2009 1525    No results found for: POCLITH, LITHIUM   Lab Results  Component Value Date   VALPROATE 85.2 11/04/2020     .res Assessment: Plan:    Lawrence Carlson was seen today for depression, manic behavior, follow-up, family problem and anxiety.  Diagnoses and all orders for this visit:  Mood disorder in conditions classified elsewhere  Mania (HCC)  Non compliance w medication regimen   Recommendations/Plan  Patient Stopped Depakote, non-compliance Olanzapine, non compliant  Will report worsening symptoms or side effects promptly. To follow up in 6 month to reevaluate per patient. Recommended psychotherapy/ DBT group therapy. Provided emergency contact numbers.  Greater than 50% of  30 min interview time with patient was spent on counseling and coordination of care. We had a Candid conversation about his moderate improvement in manic behaviors. Discussed with him that he was not compliant with taking Zyprexa and Depakote. I explained that the likelihood of severe symptoms returning was high.  I do however observe an approximate 50% improvement and  decrease in mania since original visit. However, still considered severe level.  I explained that I would follow up in 6 months but if he continued to be non-compliant, I would have to release him from my care. This was his third time I have explained the risk and consequences. He said he agreed and understood. I asked him if he wanted a referral to a higher level of care and he declined at this time.  Reviewed PDMP   Please see After Visit Summary for patient specific instructions.  Future Appointments  Date Time Provider Department Center  09/20/2021 11:30 AM Avelina Laine A, NP [facility] None    No orders of the defined types were placed in this encounter.     -------------------------------

## 2021-03-22 NOTE — Progress Notes (Deleted)
[facility]  Patient ID: Lawrence Carlson,  MRN: [id number]  PCP: Titus Dubin., MD  Date of Evaluation: 03/22/2021 Time spent:{TIME; 0 MIN TO 60 MIN:[id number]}  Chief Complaint:  Chief Complaint   Depression; Manic Behavior; Follow-up; Family Problem; Anxiety     HISTORY/CURRENT STATUS: HPI  Individual Medical History/ Review of Systems: Changes? :{EXAM; YES/NO:21197}  Allergies: Patient has no known allergies.  Current Medications:  Current Outpatient Medications:    Cholecalciferol (VITAMIN D) 50 MCG (2000 UT) tablet, Take 2,000 Units by mouth daily., Disp: , Rfl:    Magnesium 250 MG TABS, Take 250 mg by mouth daily., Disp: , Rfl:    traZODone (DESYREL) 50 MG tablet, Take 50 mg by mouth at bedtime as needed for sleep (do not take on same night as ambien)., Disp: , Rfl:    vitamin B-12 (CYANOCOBALAMIN) 1000 MCG tablet, Take 5,000 mcg by mouth daily., Disp: , Rfl:    ALPRAZolam (XANAX) 0.5 MG tablet, Take 0.5 mg by mouth 2 (two) times daily as needed for sleep., Disp: , Rfl:    Coenzyme Q10 (CO Q10) 200 MG CAPS, Take 200 mg by mouth daily. (Patient not taking: Reported on 03/22/2021), Disp: , Rfl:    Flaxseed, Linseed, (FLAX SEED OIL) 1000 MG CAPS, Take 1,000 mg by mouth daily. (Patient not taking: No sig reported), Disp: , Rfl:    meloxicam (MOBIC) 15 MG tablet, Take 15 mg by mouth daily. (Patient not taking: No sig reported), Disp: , Rfl:    Misc Natural Products (SUPER GREENS) POWD, Take 1 Scoop by mouth daily. Mix with shake daily ( Green blend super food) (Patient not taking: Reported on 03/22/2021), Disp: , Rfl:    Multiple Vitamin (MULTIVITAMIN WITH MINERALS) TABS tablet, Take 1 tablet by mouth daily., Disp: , Rfl:    omeprazole (PRILOSEC) 40 MG capsule, Take 40 mg by mouth daily. (Patient not taking: Reported on 03/22/2021), Disp: , Rfl:    OVER THE COUNTER MEDICATION, Take 237 mLs by mouth daily. Gochi drink, Disp: , Rfl:  Medication Side Effects:  {Medication Side Effects (Optional):12147}  Family Medical/ Social History: Changes? {EXAM; YES/NO:19492}  MENTAL HEALTH EXAM:  There were no vitals taken for this visit.There is no height or weight on file to calculate BMI.  General Appearance: {PSY:[id number]}  Eye Contact:  {PSY:22684}  Speech:  {PSY:[phone]}  Volume:  {PSY:22686}  Mood:  {PSY:22306}  Affect:  {PSY:3401280615}  Thought Process:  {PSY:22688}  Orientation:  {PSY:22689}  Thought Content: {PSYt:22690}   Suicidal Thoughts:  {PSY:22692}  Homicidal Thoughts:  {PSY:22692}  Memory:  {PSY:[phone]}  Judgement:  {PSY:22694}  Insight:  {PSY:22695}  Psychomotor Activity:  {PSY:22696}  Concentration:  {PSY:21399}  Recall:  {PSY:22877}  Fund of Knowledge: {PSY:22877}  Language: {MEQ:68341}  Assets:  {PSY:22698}  ADL's:  {PSY:22290}  Cognition: {PSY:304700322}  Prognosis:  {PSY:22877}    DIAGNOSES:    ICD-10-CM   1. Mood disorder in conditions classified elsewhere  F06.30     2. Mania (HCC)  F30.9     3. Non compliance w medication regimen  Z91.14       Receiving Psychotherapy: {DQQ:22979}   RECOMMENDATIONS: ***   Joan Flores, NP

## 2021-04-04 ENCOUNTER — Ambulatory Visit: Payer: Self-pay | Admitting: Student

## 2021-04-04 DIAGNOSIS — M1712 Unilateral primary osteoarthritis, left knee: Secondary | ICD-10-CM

## 2021-04-11 ENCOUNTER — Ambulatory Visit: Payer: Self-pay | Admitting: Student

## 2021-04-29 ENCOUNTER — Ambulatory Visit: Payer: Self-pay | Admitting: Student

## 2021-04-29 NOTE — H&P (Signed)
TOTAL KNEE ADMISSION H&P  Patient is being admitted for left total knee arthroplasty.  Subjective:  Chief Complaint:left knee pain.  HPI: Lawrence Carlson, 65 y.o. male, has a history of pain and functional disability in the left knee due to arthritis and has failed non-surgical conservative treatments for greater than 12 weeks to includeNSAID's and/or analgesics and activity modification.  Onset of symptoms was gradual, starting 3 years ago with gradually worsening course since that time. The patient noted no past surgery on the left knee(s).  Patient currently rates pain in the left knee(s) at 6 out of 10 with activity. Patient has worsening of pain with activity and weight bearing, pain that interferes with activities of daily living, and pain with passive range of motion.  Patient has evidence of subchondral cysts, subchondral sclerosis, and joint space narrowing by imaging studies. There is no active infection.  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      Cholecalciferol (VITAMIN D) 50 MCG (2000 UT) tablet Take 2,000 Units by mouth daily.      Coenzyme Q10 (CO Q10) 200 MG CAPS Take 200 mg by mouth daily. (Patient not taking: Reported on 03/22/2021)      Flaxseed, Linseed, (FLAX SEED OIL) 1000 MG CAPS Take 1,000 mg by mouth daily. (Patient not taking: No sig reported)      Magnesium 250 MG TABS Take 250 mg by mouth daily.      meloxicam (MOBIC) 15 MG tablet Take 15 mg by mouth daily. (Patient not taking: No sig reported)      Misc Natural Products (SUPER GREENS) POWD Take 1 Scoop by mouth daily. Mix with shake daily ( Green blend super food) (Patient not taking: Reported on 03/22/2021)      Multiple Vitamin (MULTIVITAMIN WITH MINERALS) TABS tablet Take 1 tablet by mouth daily.      omeprazole (PRILOSEC) 40 MG capsule Take 40 mg by mouth daily. (Patient not taking: Reported on 03/22/2021)      OVER THE COUNTER MEDICATION Take 237 mLs by mouth daily. Gochi drink      traZODone (DESYREL) 50 MG tablet Take 50 mg by mouth at bedtime as needed for sleep (do not take on same night as ambien).      vitamin B-12 (CYANOCOBALAMIN) 1000 MCG tablet Take 5,000 mcg by mouth daily.      No current facility-administered medications for this visit.   No Known Allergies  Social History   Tobacco  Use   Smoking status: Never   Smokeless tobacco: Never  Substance Use Topics   Alcohol use: Never    Family History  Problem Relation Age of Onset   Alcohol abuse Mother    Alcohol abuse Father      Review of Systems  Musculoskeletal:  Positive for arthralgias.  All other systems reviewed and are negative.  Objective:  Physical Exam HENT:     Head: Normocephalic.  Eyes:     Pupils: Pupils are equal, round, and reactive to light.  Cardiovascular:     Rate and Rhythm: Normal rate.  Pulmonary:     Effort: Pulmonary effort is normal.  Abdominal:     Palpations: Abdomen is soft.  Genitourinary:    Comments: Deferred Musculoskeletal:         General: Tenderness present.     Cervical back: Normal range of motion.  Skin:    General: Skin is warm.  Neurological:     Mental Status: He is alert and oriented to person, place, and time.  Psychiatric:        Behavior: Behavior normal.    Vital signs in last 24 hours: @VSRANGES @  Labs:   Estimated body mass index is 26.64 kg/m as calculated from the following:   Height as of 10/07/20: 5\' 11"  (1.803 m).   Weight as of 10/07/20: 86.6 kg.   Imaging Review Plain radiographs demonstrate severe degenerative joint disease of the left knee(s). The bone quality appears to be adequate for age and reported activity level.      Assessment/Plan:  End stage arthritis, left knee   The patient history, physical examination, clinical judgment of the provider and imaging studies are consistent with end stage degenerative joint disease of the left knee(s) and total knee arthroplasty is deemed medically necessary. The treatment options including medical management, injection therapy arthroscopy and arthroplasty were discussed at length. The risks and benefits of total knee arthroplasty were presented and reviewed. The risks due to aseptic loosening, infection, stiffness, patella tracking problems, thromboembolic complications and other imponderables were discussed. The patient acknowledged the explanation, agreed to proceed with the plan and consent was signed. Patient is being admitted for inpatient treatment for surgery, pain control, PT, OT, prophylactic antibiotics, VTE prophylaxis, progressive ambulation and ADL's and discharge planning. The patient is planning to be discharged  home     Patient's anticipated LOS is less than 2 midnights, meeting these requirements: - Younger than 54 - Lives within 1 hour of care - Has a competent adult at home to recover with post-op recover - NO history of  - Chronic pain requiring opiods  - Diabetes  - Coronary Artery Disease  - Heart  failure  - Heart attack  - Stroke  - DVT/VTE  - Cardiac arrhythmia  - Respiratory Failure/COPD  - Renal failure  - Anemia  - Advanced Liver disease

## 2021-05-03 ENCOUNTER — Ambulatory Visit: Payer: Self-pay | Admitting: Student

## 2021-05-03 NOTE — H&P (Signed)
TOTAL KNEE ADMISSION H&P  Patient is being admitted for left total knee arthroplasty.  Subjective:  Chief Complaint:left knee pain.  HPI: Lawrence Carlson, 65 y.o. male, has a history of pain and functional disability in the left knee due to arthritis and has failed non-surgical conservative treatments for greater than 12 weeks to includeNSAID's and/or analgesics and activity modification.  Onset of symptoms was gradual, starting 1 years ago with gradually worsening course since that time. The patient noted no past surgery on the left knee(s).  Patient currently rates pain in the left knee(s) at 1 out of 10 with activity. Patient has worsening of pain with activity and weight bearing, pain that interferes with activities of daily living, and pain with passive range of motion.  Patient has evidence of subchondral cysts, subchondral sclerosis, and joint space narrowing by imaging studies. There is no active infection.  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      Cholecalciferol (VITAMIN D) 50 MCG (2000 UT) tablet Take 2,000 Units by mouth daily.      Coenzyme Q10 (CO Q10) 200 MG CAPS Take 200 mg by mouth daily. (Patient not taking: Reported on 03/22/2021)      Flaxseed, Linseed, (FLAX SEED OIL) 1000 MG CAPS Take 1,000 mg by mouth daily. (Patient not taking: No sig reported)      Magnesium 250 MG TABS Take 250 mg by mouth daily.      meloxicam (MOBIC) 15 MG tablet Take 15 mg by mouth daily. (Patient not taking: No sig reported)      Misc Natural Products (SUPER GREENS) POWD Take 1 Scoop by mouth daily. Mix with shake daily ( Green blend super food) (Patient not taking: Reported on 03/22/2021)      Multiple Vitamin (MULTIVITAMIN WITH MINERALS) TABS tablet Take 1 tablet by mouth daily.      omeprazole (PRILOSEC) 40 MG capsule Take 40 mg by mouth daily. (Patient not taking: Reported on 03/22/2021)      OVER THE COUNTER MEDICATION Take 237 mLs by mouth daily. Gochi drink      traZODone (DESYREL) 50 MG tablet Take 50 mg by mouth at bedtime as needed for sleep (do not take on same night as ambien).      vitamin B-12 (CYANOCOBALAMIN) 1000 MCG tablet Take 5,000 mcg by mouth daily.      No current facility-administered medications for this visit.   No Known Allergies  Social History   Tobacco  Use   Smoking status: Never   Smokeless tobacco: Never  Substance Use Topics   Alcohol use: Never    Family History  Problem Relation Age of Onset   Alcohol abuse Mother    Alcohol abuse Father      Review of Systems  Musculoskeletal:  Positive for arthralgias.  All other systems reviewed and are negative.  Objective:  Physical Exam Constitutional:      Appearance: Normal appearance. He is normal weight.  HENT:     Head: Normocephalic.  Eyes:     Pupils: Pupils are equal, round, and reactive to light.  Cardiovascular:     Rate and Rhythm: Normal rate.  Pulmonary:     Effort: Pulmonary effort is normal.  Abdominal:     Palpations:  Abdomen is soft.  Genitourinary:    Comments: Deferred Musculoskeletal:        General: Tenderness present.     Cervical back: Normal range of motion.  Skin:    General: Skin is warm.  Neurological:     Mental Status: He is alert and oriented to person, place, and time.  Psychiatric:        Behavior: Behavior normal.    Vital signs in last 24 hours: @VSRANGES @  Labs:   Estimated body mass index is 26.64 kg/m as calculated from the following:   Height as of 10/07/20: 5\' 11"  (1.803 m).   Weight as of 10/07/20: 86.6 kg.   Imaging Review Plain radiographs demonstrate severe degenerative joint disease of the left knee(s). The bone quality appears to be adequate for age and reported activity level.      Assessment/Plan:  End stage arthritis, left knee   The patient history, physical examination, clinical judgment of the provider and imaging studies are consistent with end stage degenerative joint disease of the left knee(s) and total knee arthroplasty is deemed medically necessary. The treatment options including medical management, injection therapy arthroscopy and arthroplasty were discussed at length. The risks and benefits of total knee arthroplasty were presented and reviewed. The risks due to aseptic loosening, infection, stiffness, patella tracking problems, thromboembolic complications and other imponderables were discussed. The patient acknowledged the explanation, agreed to proceed with the plan and consent was signed. Patient is being admitted for inpatient treatment for surgery, pain control, PT, OT, prophylactic antibiotics, VTE prophylaxis, progressive ambulation and ADL's and discharge planning. The patient is planning to be discharged  home same day     Patient's anticipated LOS is less than 2 midnights, meeting these requirements: - Younger than 85 - Lives within 1 hour of care - Has a competent adult at home to recover with post-op recover - NO history of  -  Chronic pain requiring opiods  - Diabetes  - Coronary Artery Disease  - Heart failure  - Heart attack  - Stroke  - DVT/VTE  - Cardiac arrhythmia  - Respiratory Failure/COPD  - Renal failure  - Anemia  - Advanced Liver disease

## 2021-05-06 ENCOUNTER — Encounter (HOSPITAL_COMMUNITY): Payer: Medicare HMO

## 2021-05-11 NOTE — Patient Instructions (Signed)
DUE TO COVID-19 ONLY ONE VISITOR IS ALLOWED TO COME WITH YOU AND STAY IN THE WAITING ROOM ONLY DURING PRE OP AND PROCEDURE.   **NO VISITORS ARE ALLOWED IN THE SHORT STAY AREA OR RECOVERY ROOM!!**  IF YOU WILL BE ADMITTED INTO THE HOSPITAL YOU ARE ALLOWED ONLY TWO SUPPORT PEOPLE DURING VISITATION HOURS ONLY (7 AM -8PM)   The support person(s) must pass our screening, gel in and out, and wear a mask at all times, including in the patients room. Patients must also wear a mask when staff or their support person are in the room. Visitors GUEST BADGE MUST BE WORN VISIBLY  One adult visitor may remain with you overnight and MUST be in the room by 8 P.M.  No visitors under the age of 66. Any visitor under the age of 22 must be accompanied by an adult.     Your procedure is scheduled on: 05/25/21   Report to [facility] Main Entrance    Report to admitting at: 7:15 AM   Call this number if you have problems the morning of surgery [phone]   Do not eat food :After Midnight.   May have liquids until : 7:00 AM   day of surgery  CLEAR LIQUID DIET  Foods Allowed                                                                     Foods Excluded  Water, Black Coffee and tea, regular and decaf                             liquids that you cannot  Plain Jell-O in any flavor  (No red)                                           see through such as: Fruit ices (not with fruit pulp)                                     milk, soups, orange juice              Iced Popsicles (No red)                                    All solid food                                   Apple juices Sports drinks like Gatorade (No red) Lightly seasoned clear broth or consume(fat free) Sugar  Sample Menu Breakfast                                Lunch  Supper Cranberry juice                    Beef broth                            Chicken broth Jell-O                                      Grape juice                           Apple juice Coffee or tea                        Jell-O                                      Popsicle                                                Coffee or tea                        Coffee or tea      Complete one Ensure drink the morning of surgery at: 7:00 AM       the day of surgery    The day of surgery:  Drink ONE (1) Pre-Surgery Clear Ensure or G2 by am the morning of surgery. Drink in one sitting. Do not sip.  This drink was given to you during your hospital  pre-op appointment visit. Nothing else to drink after completing the  Pre-Surgery Clear Ensure or G2.          If you have questions, please contact your surgeons office.  Oral Hygiene is also important to reduce your risk of infection.                                    Remember - BRUSH YOUR TEETH THE MORNING OF SURGERY WITH YOUR REGULAR TOOTHPASTE   Do NOT smoke after Midnight   Take these medicines the morning of surgery with A SIP OF WATER: N/A  DO NOT TAKE ANY ORAL DIABETIC MEDICATIONS DAY OF YOUR SURGERY                              You may not have any metal on your body including hair pins, jewelry, and body piercing             Do not wear  lotions, powders, perfumes/cologne, or deodorant               Men may shave face and neck.   Do not bring valuables to the hospital. [facility] IS NOT             RESPONSIBLE   FOR VALUABLES.   Contacts, dentures or bridgework may not be worn into surgery.   Bring small overnight bag day of surgery.    Patients discharged on the  day of surgery will not be allowed to drive home.   Special Instructions: Bring a copy of your healthcare power of attorney and living will documents         the day of surgery if you haven't scanned them before.              Please read over the following fact sheets you were given: IF YOU HAVE QUESTIONS ABOUT YOUR PRE-OP INSTRUCTIONS PLEASE CALL [phone]     [facility] - Preparing  for Surgery Before surgery, you can play an important role.  Because skin is not sterile, your skin needs to be as free of germs as possible.  You can reduce the number of germs on your skin by washing with CHG (chlorahexidine gluconate) soap before surgery.  CHG is an antiseptic cleaner which kills germs and bonds with the skin to continue killing germs even after washing. Please DO NOT use if you have an allergy to CHG or antibacterial soaps.  If your skin becomes reddened/irritated stop using the CHG and inform your nurse when you arrive at Short Stay. Do not shave (including legs and underarms) for at least 48 hours prior to the first CHG shower.  You may shave your face/neck. Please follow these instructions carefully:  1.  Shower with CHG Soap the night before surgery and the  morning of Surgery.  2.  If you choose to wash your hair, wash your hair first as usual with your  normal  shampoo.  3.  After you shampoo, rinse your hair and body thoroughly to remove the  shampoo.                           4.  Use CHG as you would any other liquid soap.  You can apply chg directly  to the skin and wash                       Gently with a scrungie or clean washcloth.  5.  Apply the CHG Soap to your body ONLY FROM THE NECK DOWN.   Do not use on face/ open                           Wound or open sores. Avoid contact with eyes, ears mouth and genitals (private parts).                       Wash face,  Genitals (private parts) with your normal soap.             6.  Wash thoroughly, paying special attention to the area where your surgery  will be performed.  7.  Thoroughly rinse your body with warm water from the neck down.  8.  DO NOT shower/wash with your normal soap after using and rinsing off  the CHG Soap.                9.  Pat yourself dry with a clean towel.            10.  Wear clean pajamas.            11.  Place clean sheets on your bed the night of your first shower and do not  sleep with  pets. Day of Surgery : Do not apply any lotions/deodorants the morning of surgery.  Please wear clean  clothes to the hospital/surgery center.  FAILURE TO FOLLOW THESE INSTRUCTIONS MAY RESULT IN THE CANCELLATION OF YOUR SURGERY PATIENT SIGNATURE_________________________________  NURSE SIGNATURE__________________________________  ________________________________________________________________________   [facility]  An incentive spirometer is a tool that can help keep your lungs clear and active. This tool measures how well you are filling your lungs with each breath. Taking long deep breaths may help reverse or decrease the chance of developing breathing (pulmonary) problems (especially infection) following: A long period of time when you are unable to move or be active. BEFORE THE PROCEDURE  If the spirometer includes an indicator to show your best effort, your nurse or respiratory therapist will set it to a desired goal. If possible, sit up straight or lean slightly forward. Try not to slouch. Hold the incentive spirometer in an upright position. INSTRUCTIONS FOR USE  Sit on the edge of your bed if possible, or sit up as far as you can in bed or on a chair. Hold the incentive spirometer in an upright position. Breathe out normally. Place the mouthpiece in your mouth and seal your lips tightly around it. Breathe in slowly and as deeply as possible, raising the piston or the ball toward the top of the column. Hold your breath for 3-5 seconds or for as long as possible. Allow the piston or ball to fall to the bottom of the column. Remove the mouthpiece from your mouth and breathe out normally. Rest for a few seconds and repeat Steps 1 through 7 at least 10 times every 1-2 hours when you are awake. Take your time and take a few normal breaths between deep breaths. The spirometer may include an indicator to show your best effort. Use the indicator as a goal to work toward during each  repetition. After each set of 10 deep breaths, practice coughing to be sure your lungs are clear. If you have an incision (the cut made at the time of surgery), support your incision when coughing by placing a pillow or rolled up towels firmly against it. Once you are able to get out of bed, walk around indoors and cough well. You may stop using the incentive spirometer when instructed by your caregiver.  RISKS AND COMPLICATIONS Take your time so you do not get dizzy or light-headed. If you are in pain, you may need to take or ask for pain medication before doing incentive spirometry. It is harder to take a deep breath if you are having pain. AFTER USE Rest and breathe slowly and easily. It can be helpful to keep track of a log of your progress. Your caregiver can provide you with a simple table to help with this. If you are using the spirometer at home, follow these instructions: SEEK MEDICAL CARE IF:  You are having difficultly using the spirometer. You have trouble using the spirometer as often as instructed. Your pain medication is not giving enough relief while using the spirometer. You develop fever of 100.5 F (38.1 C) or higher. SEEK IMMEDIATE MEDICAL CARE IF:  You cough up bloody sputum that had not been present before. You develop fever of 102 F (38.9 C) or greater. You develop worsening pain at or near the incision site. MAKE SURE YOU:  Understand these instructions. Will watch your condition. Will get help right away if you are not doing well or get worse. Document Released: 09/25/2006 Document Revised: 08/07/2011 Document Reviewed: 11/26/2006 [facility] Patient Information 2014 [place], [place].   ________________________________________________________________________

## 2021-05-12 ENCOUNTER — Encounter (HOSPITAL_COMMUNITY)
Admission: RE | Admit: 2021-05-12 | Discharge: 2021-05-12 | Disposition: A | Discharge status: Home / Self Care | Payer: Medicare HMO | Source: Ambulatory Visit | Attending: Anesthesiology | Admitting: Anesthesiology

## 2021-05-12 DIAGNOSIS — Z01818 Encounter for other preprocedural examination: Secondary | ICD-10-CM

## 2021-05-18 ENCOUNTER — Encounter (HOSPITAL_COMMUNITY): Payer: Self-pay

## 2021-05-18 ENCOUNTER — Other Ambulatory Visit: Payer: Self-pay

## 2021-05-18 ENCOUNTER — Encounter (HOSPITAL_COMMUNITY)
Admission: RE | Admit: 2021-05-18 | Discharge: 2021-05-18 | Disposition: A | Discharge status: Home / Self Care | Payer: Medicare HMO | Source: Ambulatory Visit | Attending: Orthopedic Surgery | Admitting: Orthopedic Surgery

## 2021-05-18 DIAGNOSIS — Z01818 Encounter for other preprocedural examination: Secondary | ICD-10-CM

## 2021-05-18 DIAGNOSIS — Z01812 Encounter for preprocedural laboratory examination: Secondary | ICD-10-CM | POA: Insufficient documentation

## 2021-05-18 DIAGNOSIS — M1712 Unilateral primary osteoarthritis, left knee: Secondary | ICD-10-CM | POA: Diagnosis not present

## 2021-05-18 LAB — COMPREHENSIVE METABOLIC PANEL
ALT: 30 U/L (ref 0–44)
AST: 33 U/L (ref 15–41)
Albumin: 3.9 g/dL (ref 3.5–5.0)
Alkaline Phosphatase: 56 U/L (ref 38–126)
Anion gap: 6 (ref 5–15)
BUN: 23 mg/dL (ref 8–23)
CO2: 28 mmol/L (ref 22–32)
Calcium: 9.2 mg/dL (ref 8.9–10.3)
Chloride: 104 mmol/L (ref 98–111)
Creatinine, Ser: 1.19 mg/dL (ref 0.61–1.24)
GFR, Estimated: >60 mL/min (ref >60)
Glucose, Bld: 105 mg/dL — ABNORMAL HIGH (ref 70–99)
Potassium: 5.4 mmol/L — ABNORMAL HIGH (ref 3.5–5.1)
Sodium: 138 mmol/L (ref 135–145)
Total Bilirubin: 0.6 mg/dL (ref 0.3–1.2)
Total Protein: 7 g/dL (ref 6.5–8.1)

## 2021-05-18 LAB — CBC
HCT: 44.3 % (ref 39.0–52.0)
Hemoglobin: 14.9 g/dL (ref 13.0–17.0)
MCH: 32.3 pg (ref 26.0–34.0)
MCHC: 33.6 g/dL (ref 30.0–36.0)
MCV: 95.9 fL (ref 80.0–100.0)
Platelets: 152 10*3/uL (ref 150–400)
RBC: 4.62 MIL/uL (ref 4.22–5.81)
RDW: 13.4 % (ref 11.5–15.5)
WBC: 3.7 10*3/uL — ABNORMAL LOW (ref 4.0–10.5)
nRBC: 0 % (ref 0.0–0.2)

## 2021-05-18 LAB — PROTIME-INR
INR: 1.1 (ref 0.8–1.2)
Prothrombin Time: 13.7 seconds (ref 11.4–15.2)

## 2021-05-18 LAB — SURGICAL PCR SCREEN
MRSA, PCR: NEGATIVE
Staphylococcus aureus: POSITIVE — AB

## 2021-05-18 NOTE — Progress Notes (Signed)
COVID Vaccine Completed:NO Date COVID Vaccine completed: COVID vaccine manufacturer: Pfizer    [facility] [employer]  COVID Test: N/A PCP - Dr. Arletta Bale. Clearance: Adela Glimpse: PA: 03/31/21: chart Cardiologist -   Chest x-ray -  EKG - 09/17/20 Stress Test -  ECHO -  Cardiac Cath -  Pacemaker/ICD device last checked:  Sleep Study -  CPAP -   Fasting Blood Sugar -  Checks Blood Sugar _____ times a day  Blood Thinner Instructions: Aspirin Instructions: Last Dose:  Anesthesia review: Hx: DVT,PE.  Patient denies shortness of breath, fever, cough and chest pain at PAT appointment   Patient verbalized understanding of instructions that were given to them at the PAT appointment. Patient was also instructed that they will need to review over the PAT instructions again at home before surgery.

## 2021-05-19 NOTE — Progress Notes (Signed)
PCR: + STAPH °

## 2021-05-25 ENCOUNTER — Ambulatory Visit (HOSPITAL_COMMUNITY): Admission: RE | Admit: 2021-05-25 | Payer: Medicare HMO | Source: Home / Self Care | Admitting: Orthopedic Surgery

## 2021-05-25 LAB — TYPE AND SCREEN
ABO/RH(D): A POS
Antibody Screen: NEGATIVE

## 2021-05-31 ENCOUNTER — Ambulatory Visit: Payer: Self-pay | Admitting: Student

## 2021-06-09 NOTE — Progress Notes (Signed)
Sent message, via epic in basket, requesting orders in epic from surgeon.  

## 2021-06-17 NOTE — Patient Instructions (Signed)
DUE TO COVID-19 ONLY ONE VISITOR IS ALLOWED TO COME WITH YOU AND STAY IN THE WAITING ROOM ONLY DURING PRE OP AND PROCEDURE.   **NO VISITORS ARE ALLOWED IN THE SHORT STAY AREA OR RECOVERY ROOM!!**  IF YOU WILL BE ADMITTED INTO THE HOSPITAL YOU ARE ALLOWED ONLY TWO SUPPORT PEOPLE DURING VISITATION HOURS ONLY (7 AM -8PM)   The support person(s) must pass our screening, gel in and out, and wear a mask at all times, including in the patients room. Patients must also wear a mask when staff or their support person are in the room. Visitors GUEST BADGE MUST BE WORN VISIBLY  One adult visitor may remain with you overnight and MUST be in the room by 8 P.M.  No visitors under the age of 55. Any visitor under the age of 41 must be accompanied by an adult.        Your procedure is scheduled on: 06/29/21   Report to [facility] Main Entrance    Report to admitting at: 6:00 AM   Call this number if you have problems the morning of surgery [phone]   Do not eat food :After Midnight.   May have liquids until: 5:50 AM    day of surgery  CLEAR LIQUID DIET  Foods Allowed                                                                     Foods Excluded  Water, Black Coffee and tea, regular and decaf                             liquids that you cannot  Plain Jell-O in any flavor  (No red)                                           see through such as: Fruit ices (not with fruit pulp)                                     milk, soups, orange juice              Iced Popsicles (No red)                                    All solid food                                   Apple juices Sports drinks like Gatorade (No red) Lightly seasoned clear broth or consume(fat free) Sugar Sample Menu Breakfast                                Lunch  Supper Cranberry juice                    Beef broth                            Chicken broth Jell-O                                      Grape juice                           Apple juice Coffee or tea                        Jell-O                                      Popsicle                                                Coffee or tea                        Coffee or tea   Oral Hygiene is also important to reduce your risk of infection.                                    Remember - BRUSH YOUR TEETH THE MORNING OF SURGERY WITH YOUR REGULAR TOOTHPASTE   Do NOT smoke after Midnight   Take these medicines the morning of surgery with A SIP OF WATER:N/A   DO NOT TAKE ANY ORAL DIABETIC MEDICATIONS DAY OF YOUR SURGERY                              You may not have any metal on your body including hair pins, jewelry, and body piercing             Do not wear lotions, powders, perfumes/cologne, or deodorant              Men may shave face and neck.   Do not bring valuables to the hospital. [place].   Contacts, dentures or bridgework may not be worn into surgery.   Bring small overnight bag day of surgery.    Patients discharged on the day of surgery will not be allowed to drive home.  Someone needs to stay with you for the first 24 hours after anesthesia.   Special Instructions: Bring a copy of your healthcare power of attorney and living will documents         the day of surgery if you haven't scanned them before.              Please read over the following fact sheets you were given: IF YOU HAVE QUESTIONS ABOUT YOUR PRE-OP INSTRUCTIONS PLEASE CALL [facility] - Preparing  for Surgery Before surgery, you can play an important role.  Because skin is not sterile, your skin needs to be as free of germs as possible.  You can reduce the number of germs on your skin by washing with CHG (chlorahexidine gluconate) soap before surgery.  CHG is an antiseptic cleaner which kills germs and bonds with the skin to continue killing germs even after washing. Please DO  NOT use if you have an allergy to CHG or antibacterial soaps.  If your skin becomes reddened/irritated stop using the CHG and inform your nurse when you arrive at Short Stay. Do not shave (including legs and underarms) for at least 48 hours prior to the first CHG shower.  You may shave your face/neck. Please follow these instructions carefully:  1.  Shower with CHG Soap the night before surgery and the  morning of Surgery.  2.  If you choose to wash your hair, wash your hair first as usual with your  normal  shampoo.  3.  After you shampoo, rinse your hair and body thoroughly to remove the  shampoo.                           4.  Use CHG as you would any other liquid soap.  You can apply chg directly  to the skin and wash                       Gently with a scrungie or clean washcloth.  5.  Apply the CHG Soap to your body ONLY FROM THE NECK DOWN.   Do not use on face/ open                           Wound or open sores. Avoid contact with eyes, ears mouth and genitals (private parts).                       Wash face,  Genitals (private parts) with your normal soap.             6.  Wash thoroughly, paying special attention to the area where your surgery  will be performed.  7.  Thoroughly rinse your body with warm water from the neck down.  8.  DO NOT shower/wash with your normal soap after using and rinsing off  the CHG Soap.                9.  Pat yourself dry with a clean towel.            10.  Wear clean pajamas.            11.  Place clean sheets on your bed the night of your first shower and do not  sleep with pets. Day of Surgery : Do not apply any lotions/deodorants the morning of surgery.  Please wear clean clothes to the hospital/surgery center.  FAILURE TO FOLLOW THESE INSTRUCTIONS MAY RESULT IN THE CANCELLATION OF YOUR SURGERY PATIENT SIGNATURE_________________________________  NURSE  SIGNATURE__________________________________  ________________________________________________________________________

## 2021-06-20 ENCOUNTER — Encounter (HOSPITAL_COMMUNITY): Payer: Self-pay

## 2021-06-20 ENCOUNTER — Encounter (HOSPITAL_COMMUNITY)
Admission: RE | Admit: 2021-06-20 | Discharge: 2021-06-20 | Disposition: A | Discharge status: Home / Self Care | Payer: Medicare HMO | Source: Ambulatory Visit | Attending: Orthopedic Surgery | Admitting: Orthopedic Surgery

## 2021-06-20 ENCOUNTER — Other Ambulatory Visit: Payer: Self-pay

## 2021-06-20 VITALS — BP 131/79 | HR 82 | Temp 98.3°F | Ht 71.0 in | Wt 186.0 lb

## 2021-06-20 DIAGNOSIS — Z01818 Encounter for other preprocedural examination: Secondary | ICD-10-CM

## 2021-06-20 DIAGNOSIS — Z01812 Encounter for preprocedural laboratory examination: Secondary | ICD-10-CM | POA: Insufficient documentation

## 2021-06-20 LAB — CBC
HCT: 44.9 % (ref 39.0–52.0)
Hemoglobin: 15.1 g/dL (ref 13.0–17.0)
MCH: 31.9 pg (ref 26.0–34.0)
MCHC: 33.6 g/dL (ref 30.0–36.0)
MCV: 94.7 fL (ref 80.0–100.0)
Platelets: 176 10*3/uL (ref 150–400)
RBC: 4.74 MIL/uL (ref 4.22–5.81)
RDW: 12.9 % (ref 11.5–15.5)
WBC: 5.6 10*3/uL (ref 4.0–10.5)
nRBC: 0 % (ref 0.0–0.2)

## 2021-06-20 LAB — SURGICAL PCR SCREEN
MRSA, PCR: NEGATIVE
Staphylococcus aureus: POSITIVE — AB

## 2021-06-20 NOTE — Progress Notes (Signed)
PCR: + STAPH °

## 2021-06-20 NOTE — Progress Notes (Signed)
COVID Vaccine Completed:NO Date COVID Vaccine completed: COVID vaccine manufacturer: Pfizer    [facility] [employer]  COVID Test: N/A PCP - Dr. Arletta Bale. Clearance: Adela Glimpse: PA: 03/31/21: chart. Cardiologist - N/A  Chest x-ray -  EKG - 09/17/20 Stress Test -  ECHO -  Cardiac Cath -  Pacemaker/ICD device last checked:  Sleep Study -  CPAP -   Fasting Blood Sugar -  Checks Blood Sugar _____ times a day  Blood Thinner Instructions: Aspirin Instructions: Last Dose:  Anesthesia review: Hx: DVT,PE,paralyzed vocal cord.  Patient denies shortness of breath, fever, cough and chest pain at PAT appointment   Patient verbalized understanding of instructions that were given to them at the PAT appointment. Patient was also instructed that they will need to review over the PAT instructions again at home before surgery.

## 2021-06-28 NOTE — Anesthesia Preprocedure Evaluation (Addendum)
Anesthesia Evaluation  Patient identified by MRN, date of birth, ID band Patient awake    Reviewed: Allergy & Precautions, H&P , NPO status , Patient's Chart, lab work & pertinent test results  Airway Mallampati: I  TM Distance: >3 FB Neck ROM: Full    Dental no notable dental hx. (+) Teeth Intact, Dental Advisory Given, Partial Upper   Pulmonary neg pulmonary ROS,    Pulmonary exam normal breath sounds clear to auscultation       Cardiovascular Exercise Tolerance: Good negative cardio ROS Normal cardiovascular exam Rhythm:Regular Rate:Normal     Neuro/Psych PSYCHIATRIC DISORDERS Anxiety Depression negative neurological ROS  negative psych ROS   GI/Hepatic negative GI ROS, Neg liver ROS,   Endo/Other  negative endocrine ROS  Renal/GU negative Renal ROS  negative genitourinary   Musculoskeletal negative musculoskeletal ROS (+) Arthritis , Osteoarthritis,    Abdominal   Peds negative pediatric ROS (+)  Hematology negative hematology ROS (+)   Anesthesia Other Findings   Reproductive/Obstetrics negative OB ROS                            Anesthesia Physical Anesthesia Plan  ASA: 2  Anesthesia Plan: Regional, MAC and Spinal   Post-op Pain Management: Regional block   Induction:   PONV Risk Score and Plan: 1 and Ondansetron, Midazolam and Propofol infusion  Airway Management Planned: Natural Airway, Nasal Cannula and Simple Face Mask  Additional Equipment: None  Intra-op Plan:   Post-operative Plan:   Informed Consent: I have reviewed the patients History and Physical, chart, labs and discussed the procedure including the risks, benefits and alternatives for the proposed anesthesia with the patient or authorized representative who has indicated his/her understanding and acceptance.       Plan Discussed with: Anesthesiologist and CRNA  Anesthesia Plan Comments: (  )         Anesthesia Quick Evaluation

## 2021-06-29 ENCOUNTER — Encounter (HOSPITAL_COMMUNITY)
Admission: RE | Disposition: A | Discharge status: Home / Self Care | Payer: Self-pay | Source: Ambulatory Visit | Attending: Orthopedic Surgery

## 2021-06-29 ENCOUNTER — Ambulatory Visit (HOSPITAL_COMMUNITY)
Admission: RE | Admit: 2021-06-29 | Discharge: 2021-06-29 | Disposition: A | Discharge status: Home / Self Care | Payer: Medicare HMO | Source: Ambulatory Visit | Attending: Orthopedic Surgery | Admitting: Orthopedic Surgery

## 2021-06-29 ENCOUNTER — Ambulatory Visit (HOSPITAL_COMMUNITY): Payer: Medicare HMO

## 2021-06-29 ENCOUNTER — Other Ambulatory Visit: Payer: Self-pay

## 2021-06-29 ENCOUNTER — Ambulatory Visit (HOSPITAL_COMMUNITY): Payer: Medicare HMO | Admitting: Physician Assistant

## 2021-06-29 ENCOUNTER — Encounter (HOSPITAL_COMMUNITY): Admission: RE | Payer: Self-pay | Source: Home / Self Care

## 2021-06-29 ENCOUNTER — Encounter (HOSPITAL_COMMUNITY): Payer: Self-pay | Admitting: Orthopedic Surgery

## 2021-06-29 DIAGNOSIS — F419 Anxiety disorder, unspecified: Secondary | ICD-10-CM | POA: Insufficient documentation

## 2021-06-29 DIAGNOSIS — M1712 Unilateral primary osteoarthritis, left knee: Secondary | ICD-10-CM | POA: Insufficient documentation

## 2021-06-29 DIAGNOSIS — Z96652 Presence of left artificial knee joint: Secondary | ICD-10-CM

## 2021-06-29 HISTORY — PX: KNEE ARTHROPLASTY: SHX992

## 2021-06-29 LAB — TYPE AND SCREEN
ABO/RH(D): A POS
Antibody Screen: NEGATIVE

## 2021-06-29 SURGERY — ARTHROPLASTY, KNEE, TOTAL, USING IMAGELESS COMPUTER-ASSISTED NAVIGATION
Anesthesia: Spinal | Site: Knee | Laterality: Left

## 2021-06-29 SURGERY — ARTHROPLASTY, KNEE, TOTAL, USING IMAGELESS COMPUTER-ASSISTED NAVIGATION
Anesthesia: Monitor Anesthesia Care | Site: Knee | Laterality: Left

## 2021-06-29 MED ORDER — HYDROCODONE-ACETAMINOPHEN 5-325 MG PO TABS: 1.0000 | ORAL_TABLET | ORAL | Status: DC | PRN

## 2021-06-29 MED ORDER — ROPIVACAINE HCL 7.5 MG/ML IJ SOLN
INTRAMUSCULAR | Status: DC | PRN
  Administered 2021-06-29: 25 mL via PERINEURAL

## 2021-06-29 MED ORDER — ACETAMINOPHEN 10 MG/ML IV SOLN
INTRAVENOUS | Status: AC
  Filled 2021-06-29: qty 100

## 2021-06-29 MED ORDER — FENTANYL CITRATE (PF) 100 MCG/2ML IJ SOLN
INTRAMUSCULAR | Status: AC
  Filled 2021-06-29: qty 2

## 2021-06-29 MED ORDER — MIDAZOLAM HCL 2 MG/2ML IJ SOLN
INTRAMUSCULAR | Status: AC
  Filled 2021-06-29: qty 2

## 2021-06-29 MED ORDER — ONDANSETRON HCL 4 MG PO TABS: 4.0000 mg | ORAL_TABLET | Freq: Four times a day (QID) | ORAL | Status: DC | PRN

## 2021-06-29 MED ORDER — METHOCARBAMOL 500 MG IVPB - SIMPLE MED: 500.0000 mg | Freq: Four times a day (QID) | INTRAVENOUS | Status: DC | PRN

## 2021-06-29 MED ORDER — ACETAMINOPHEN 160 MG/5ML PO SOLN: 325.0000 mg | ORAL | Status: DC | PRN

## 2021-06-29 MED ORDER — PROPOFOL 1000 MG/100ML IV EMUL
INTRAVENOUS | Status: AC
  Filled 2021-06-29: qty 100

## 2021-06-29 MED ORDER — PROPOFOL 500 MG/50ML IV EMUL
INTRAVENOUS | Status: DC | PRN
  Administered 2021-06-29: 30 ug/kg/min via INTRAVENOUS

## 2021-06-29 MED ORDER — METHOCARBAMOL 500 MG PO TABS: 500.0000 mg | ORAL_TABLET | Freq: Four times a day (QID) | ORAL | Status: DC | PRN

## 2021-06-29 MED ORDER — LACTATED RINGERS IV BOLUS
500.0000 mL | Freq: Once | INTRAVENOUS | Status: AC
  Administered 2021-06-29: 500 mL via INTRAVENOUS

## 2021-06-29 MED ORDER — PHENYLEPHRINE 40 MCG/ML (10ML) SYRINGE FOR IV PUSH (FOR BLOOD PRESSURE SUPPORT)
PREFILLED_SYRINGE | INTRAVENOUS | Status: DC | PRN
  Administered 2021-06-29 (×5): 80 ug via INTRAVENOUS

## 2021-06-29 MED ORDER — BUPIVACAINE-EPINEPHRINE 0.25% -1:200000 IJ SOLN
INTRAMUSCULAR | Status: DC | PRN
  Administered 2021-06-29: 30 mL

## 2021-06-29 MED ORDER — MEPERIDINE HCL 50 MG/ML IJ SOLN: 6.2500 mg | INTRAMUSCULAR | Status: DC | PRN

## 2021-06-29 MED ORDER — PHENYLEPHRINE HCL (PRESSORS) 10 MG/ML IV SOLN
INTRAVENOUS | Status: AC
  Filled 2021-06-29: qty 1

## 2021-06-29 MED ORDER — DOCUSATE SODIUM 100 MG PO CAPS: 100.0000 mg | ORAL_CAPSULE | Freq: Two times a day (BID) | ORAL | 1 refills | Status: AC

## 2021-06-29 MED ORDER — HYDROCODONE-ACETAMINOPHEN 5-325 MG PO TABS: 1.0000 | ORAL_TABLET | ORAL | 0 refills | Status: DC | PRN

## 2021-06-29 MED ORDER — SENNA 8.6 MG PO TABS: 2.0000 | ORAL_TABLET | Freq: Every day | ORAL | 1 refills | Status: AC

## 2021-06-29 MED ORDER — ACETAMINOPHEN 325 MG PO TABS: 325.0000 mg | ORAL_TABLET | ORAL | Status: DC | PRN

## 2021-06-29 MED ORDER — MORPHINE SULFATE (PF) 2 MG/ML IV SOLN: 0.5000 mg | INTRAVENOUS | Status: DC | PRN

## 2021-06-29 MED ORDER — ACETAMINOPHEN 10 MG/ML IV SOLN
INTRAVENOUS | Status: DC | PRN
  Administered 2021-06-29: 1000 mg via INTRAVENOUS

## 2021-06-29 MED ORDER — EPHEDRINE SULFATE-NACL 50-0.9 MG/10ML-% IV SOSY
PREFILLED_SYRINGE | INTRAVENOUS | Status: DC | PRN
  Administered 2021-06-29 (×3): 5 mg via INTRAVENOUS

## 2021-06-29 MED ORDER — SODIUM CHLORIDE (PF) 0.9 % IJ SOLN
INTRAMUSCULAR | Status: DC | PRN
  Administered 2021-06-29: 30 mL

## 2021-06-29 MED ORDER — CEFAZOLIN SODIUM-DEXTROSE 2-4 GM/100ML-% IV SOLN
INTRAVENOUS | Status: AC
  Filled 2021-06-29: qty 100

## 2021-06-29 MED ORDER — OXYCODONE HCL 5 MG/5ML PO SOLN: 5.0000 mg | Freq: Once | ORAL | Status: DC | PRN

## 2021-06-29 MED ORDER — CEFAZOLIN SODIUM-DEXTROSE 2-4 GM/100ML-% IV SOLN
2.0000 g | Freq: Four times a day (QID) | INTRAVENOUS | Status: DC
  Administered 2021-06-29: 2 g via INTRAVENOUS

## 2021-06-29 MED ORDER — FENTANYL CITRATE PF 50 MCG/ML IJ SOSY: 25.0000 ug | PREFILLED_SYRINGE | INTRAMUSCULAR | Status: DC | PRN

## 2021-06-29 MED ORDER — ONDANSETRON HCL 4 MG/2ML IJ SOLN
INTRAMUSCULAR | Status: DC | PRN
  Administered 2021-06-29: 4 mg via INTRAVENOUS

## 2021-06-29 MED ORDER — ONDANSETRON HCL 4 MG/2ML IJ SOLN: 4.0000 mg | Freq: Four times a day (QID) | INTRAMUSCULAR | Status: DC | PRN

## 2021-06-29 MED ORDER — TRANEXAMIC ACID-NACL 1000-0.7 MG/100ML-% IV SOLN
INTRAVENOUS | Status: DC | PRN
Start: 2021-06-29 — End: 2021-06-29
  Administered 2021-06-29: 1000 mg via INTRAVENOUS

## 2021-06-29 MED ORDER — ISOPROPYL ALCOHOL 70 % SOLN
Status: AC
  Filled 2021-06-29: qty 480

## 2021-06-29 MED ORDER — KETOROLAC TROMETHAMINE 30 MG/ML IJ SOLN
INTRAMUSCULAR | Status: DC | PRN
  Administered 2021-06-29: 30 mg via INTRA_ARTICULAR

## 2021-06-29 MED ORDER — METOCLOPRAMIDE HCL 5 MG PO TABS: 5.0000 mg | ORAL_TABLET | Freq: Three times a day (TID) | ORAL | Status: DC | PRN

## 2021-06-29 MED ORDER — MIDAZOLAM HCL 5 MG/5ML IJ SOLN
INTRAMUSCULAR | Status: DC | PRN
Start: 2021-06-29 — End: 2021-06-29
  Administered 2021-06-29: 2 mg via INTRAVENOUS

## 2021-06-29 MED ORDER — BUPIVACAINE-EPINEPHRINE (PF) 0.25% -1:200000 IJ SOLN
INTRAMUSCULAR | Status: AC
  Filled 2021-06-29: qty 30

## 2021-06-29 MED ORDER — STERILE WATER FOR IRRIGATION IR SOLN
Status: DC | PRN
  Administered 2021-06-29: 2000 mL

## 2021-06-29 MED ORDER — DEXAMETHASONE SODIUM PHOSPHATE 10 MG/ML IJ SOLN
INTRAMUSCULAR | Status: AC
  Filled 2021-06-29: qty 1

## 2021-06-29 MED ORDER — PROPOFOL 10 MG/ML IV BOLUS
INTRAVENOUS | Status: DC | PRN
Start: 2021-06-29 — End: 2021-06-29
  Administered 2021-06-29: 20 mg via INTRAVENOUS
  Administered 2021-06-29: 30 mg via INTRAVENOUS

## 2021-06-29 MED ORDER — PHENYLEPHRINE 40 MCG/ML (10ML) SYRINGE FOR IV PUSH (FOR BLOOD PRESSURE SUPPORT)
PREFILLED_SYRINGE | INTRAVENOUS | Status: AC
  Filled 2021-06-29: qty 10

## 2021-06-29 MED ORDER — DEXMEDETOMIDINE (PRECEDEX) IN NS 20 MCG/5ML (4 MCG/ML) IV SYRINGE
PREFILLED_SYRINGE | INTRAVENOUS | Status: DC | PRN
  Administered 2021-06-29: 8 ug via INTRAVENOUS

## 2021-06-29 MED ORDER — CHLORHEXIDINE GLUCONATE 0.12 % MT SOLN
15.0000 mL | Freq: Once | OROMUCOSAL | Status: AC
  Administered 2021-06-29: 15 mL via OROMUCOSAL

## 2021-06-29 MED ORDER — SODIUM CHLORIDE 0.9 % IR SOLN
Status: DC | PRN
  Administered 2021-06-29: 1000 mL

## 2021-06-29 MED ORDER — PHENYLEPHRINE HCL-NACL 20-0.9 MG/250ML-% IV SOLN
INTRAVENOUS | Status: DC | PRN
  Administered 2021-06-29: 25 ug/min via INTRAVENOUS

## 2021-06-29 MED ORDER — CEFAZOLIN SODIUM-DEXTROSE 2-3 GM-%(50ML) IV SOLR
INTRAVENOUS | Status: DC | PRN
Start: 2021-06-29 — End: 2021-06-29
  Administered 2021-06-29: 2 g via INTRAVENOUS

## 2021-06-29 MED ORDER — KETOROLAC TROMETHAMINE 15 MG/ML IJ SOLN: 7.5000 mg | Freq: Four times a day (QID) | INTRAMUSCULAR | Status: DC

## 2021-06-29 MED ORDER — TRANEXAMIC ACID-NACL 1000-0.7 MG/100ML-% IV SOLN
INTRAVENOUS | Status: AC
  Filled 2021-06-29: qty 100

## 2021-06-29 MED ORDER — METHOCARBAMOL 500 MG PO TABS: 500.0000 mg | ORAL_TABLET | Freq: Four times a day (QID) | ORAL | 0 refills | Status: DC | PRN

## 2021-06-29 MED ORDER — APIXABAN 2.5 MG PO TABS: 2.5000 mg | ORAL_TABLET | Freq: Two times a day (BID) | ORAL | 0 refills | Status: DC

## 2021-06-29 MED ORDER — LACTATED RINGERS IV BOLUS
250.0000 mL | Freq: Once | INTRAVENOUS | Status: AC
  Administered 2021-06-29: 250 mL via INTRAVENOUS

## 2021-06-29 MED ORDER — FENTANYL CITRATE (PF) 100 MCG/2ML IJ SOLN
INTRAMUSCULAR | Status: DC | PRN
  Administered 2021-06-29 (×2): 50 ug via INTRAVENOUS

## 2021-06-29 MED ORDER — ONDANSETRON HCL 4 MG/2ML IJ SOLN: 4.0000 mg | Freq: Once | INTRAMUSCULAR | Status: DC | PRN

## 2021-06-29 MED ORDER — ONDANSETRON HCL 4 MG PO TABS: 4.0000 mg | ORAL_TABLET | Freq: Three times a day (TID) | ORAL | 0 refills | Status: DC | PRN

## 2021-06-29 MED ORDER — OXYCODONE HCL 5 MG PO TABS: 5.0000 mg | ORAL_TABLET | Freq: Once | ORAL | Status: DC | PRN

## 2021-06-29 MED ORDER — LACTATED RINGERS IV SOLN: INTRAVENOUS | Status: DC

## 2021-06-29 MED ORDER — HYDROCODONE-ACETAMINOPHEN 7.5-325 MG PO TABS: 1.0000 | ORAL_TABLET | ORAL | Status: DC | PRN

## 2021-06-29 MED ORDER — LACTATED RINGERS IV BOLUS
250.0000 mL | Freq: Once | INTRAVENOUS | Status: DC
Start: 2021-06-29 — End: 2021-06-29

## 2021-06-29 MED ORDER — EPHEDRINE 5 MG/ML INJ
INTRAVENOUS | Status: AC
  Filled 2021-06-29: qty 5

## 2021-06-29 MED ORDER — DEXAMETHASONE SODIUM PHOSPHATE 10 MG/ML IJ SOLN
INTRAMUSCULAR | Status: DC | PRN
  Administered 2021-06-29: 10 mg via INTRAVENOUS

## 2021-06-29 MED ORDER — ACETAMINOPHEN 325 MG PO TABS: 325.0000 mg | ORAL_TABLET | Freq: Four times a day (QID) | ORAL | Status: DC | PRN

## 2021-06-29 MED ORDER — KETOROLAC TROMETHAMINE 30 MG/ML IJ SOLN
INTRAMUSCULAR | Status: AC
  Filled 2021-06-29: qty 1

## 2021-06-29 MED ORDER — ORAL CARE MOUTH RINSE: 15.0000 mL | Freq: Once | OROMUCOSAL | Status: AC

## 2021-06-29 MED ORDER — METOCLOPRAMIDE HCL 5 MG/ML IJ SOLN: 5.0000 mg | Freq: Three times a day (TID) | INTRAMUSCULAR | Status: DC | PRN

## 2021-06-29 MED ORDER — ISOPROPYL ALCOHOL 70 % SOLN
Status: DC | PRN
  Administered 2021-06-29: 1 via TOPICAL

## 2021-06-29 MED ORDER — ONDANSETRON HCL 4 MG/2ML IJ SOLN
INTRAMUSCULAR | Status: AC
  Filled 2021-06-29: qty 2

## 2021-06-29 SURGICAL SUPPLY — 66 items
BAG COUNTER SPONGE SURGICOUNT (BAG) IMPLANT
BAG ZIPLOCK 12X15 (MISCELLANEOUS) IMPLANT
BATTERY INSTRU NAVIGATION (MISCELLANEOUS) ×6 IMPLANT
BLADE SAW RECIPROCATING 77.5 (BLADE) ×2 IMPLANT
BNDG ELASTIC 4X5.8 VLCR STR LF (GAUZE/BANDAGES/DRESSINGS) ×2 IMPLANT
BNDG ELASTIC 6X10 VLCR STRL LF (GAUZE/BANDAGES/DRESSINGS) ×1 IMPLANT
BNDG ELASTIC 6X5.8 VLCR STR LF (GAUZE/BANDAGES/DRESSINGS) ×2 IMPLANT
CHLORAPREP W/TINT 26 (MISCELLANEOUS) ×4 IMPLANT
COMP FEMORAL TRIATHLON LFT SZ7 (Orthopedic Implant) ×2 IMPLANT
COMPONENT FEMRL TRTHLON LT SZ7 (Orthopedic Implant) IMPLANT
COVER SURGICAL LIGHT HANDLE (MISCELLANEOUS) ×2 IMPLANT
DERMABOND ADVANCED (GAUZE/BANDAGES/DRESSINGS) ×1
DERMABOND ADVANCED .7 DNX12 (GAUZE/BANDAGES/DRESSINGS) ×2 IMPLANT
DRAPE INCISE IOBAN 66X45 STRL (DRAPES) ×2 IMPLANT
DRAPE SHEET LG 3/4 BI-LAMINATE (DRAPES) ×6 IMPLANT
DRAPE U-SHAPE 47X51 STRL (DRAPES) ×2 IMPLANT
DRSG AQUACEL AG ADV 3.5X14 (GAUZE/BANDAGES/DRESSINGS) ×2 IMPLANT
ELECT BLADE TIP CTD 4 INCH (ELECTRODE) ×2 IMPLANT
ELECT REM PT RETURN 15FT ADLT (MISCELLANEOUS) ×2 IMPLANT
GAUZE 4X4 16PLY ~~LOC~~+RFID DBL (SPONGE) IMPLANT
GAUZE SPONGE 4X4 12PLY STRL (GAUZE/BANDAGES/DRESSINGS) ×2 IMPLANT
GLOVE SRG 8 PF TXTR STRL LF DI (GLOVE) ×2 IMPLANT
GLOVE SURG ENC MOIS LTX SZ8.5 (GLOVE) ×4 IMPLANT
GLOVE SURG ENC TEXT LTX SZ7.5 (GLOVE) ×6 IMPLANT
GLOVE SURG UNDER POLY LF SZ8 (GLOVE) ×2
GLOVE SURG UNDER POLY LF SZ8.5 (GLOVE) ×2 IMPLANT
GOWN SPEC L3 XXLG W/TWL (GOWN DISPOSABLE) ×2 IMPLANT
GOWN STRL REUS W/TWL XL LVL3 (GOWN DISPOSABLE) ×2 IMPLANT
HANDPIECE INTERPULSE COAX TIP (DISPOSABLE) ×1
HOLDER FOLEY CATH W/STRAP (MISCELLANEOUS) ×2 IMPLANT
HOOD PEEL AWAY FLYTE STAYCOOL (MISCELLANEOUS) ×6 IMPLANT
INSERT KNEE TIB BRG SZ 6 (Insert) ×1 IMPLANT
JET LAVAGE IRRISEPT WOUND (IRRIGATION / IRRIGATOR)
KIT TURNOVER KIT A (KITS) IMPLANT
KNEE TIBIAL COMPONENT SZ6 (Knees) ×1 IMPLANT
LAVAGE JET IRRISEPT WOUND (IRRIGATION / IRRIGATOR) IMPLANT
MARKER SKIN DUAL TIP RULER LAB (MISCELLANEOUS) ×2 IMPLANT
NDL SAFETY ECLIPSE 18X1.5 (NEEDLE) ×1 IMPLANT
NDL SPNL 18GX3.5 QUINCKE PK (NEEDLE) ×1 IMPLANT
NEEDLE HYPO 18GX1.5 SHARP (NEEDLE) ×1
NEEDLE SPNL 18GX3.5 QUINCKE PK (NEEDLE) ×2 IMPLANT
NS IRRIG 1000ML POUR BTL (IV SOLUTION) ×2 IMPLANT
PACK TOTAL KNEE CUSTOM (KITS) ×2 IMPLANT
PADDING CAST COTTON 6X4 STRL (CAST SUPPLIES) ×2 IMPLANT
PATELLA ASYMMETRIC 38X11 KNEE (Orthopedic Implant) ×1 IMPLANT
PROTECTOR NERVE ULNAR (MISCELLANEOUS) ×2 IMPLANT
SAW OSC TIP CART 19.5X105X1.3 (SAW) ×2 IMPLANT
SEALER BIPOLAR AQUA 6.0 (INSTRUMENTS) ×2 IMPLANT
SET HNDPC FAN SPRY TIP SCT (DISPOSABLE) ×1 IMPLANT
SET PAD KNEE POSITIONER (MISCELLANEOUS) ×2 IMPLANT
SPIKE FLUID TRANSFER (MISCELLANEOUS) ×4 IMPLANT
SPONGE T-LAP 18X18 ~~LOC~~+RFID (SPONGE) ×6 IMPLANT
SUT MNCRL AB 3-0 PS2 18 (SUTURE) ×2 IMPLANT
SUT MNCRL AB 4-0 PS2 18 (SUTURE) ×2 IMPLANT
SUT MON AB 2-0 CT1 36 (SUTURE) ×2 IMPLANT
SUT STRATAFIX PDO 1 14 VIOLET (SUTURE) ×1
SUT STRATFX PDO 1 14 VIOLET (SUTURE) ×1
SUT VIC AB 1 CTX 36 (SUTURE) ×2
SUT VIC AB 1 CTX36XBRD ANBCTR (SUTURE) ×2 IMPLANT
SUT VIC AB 2-0 CT1 27 (SUTURE) ×1
SUT VIC AB 2-0 CT1 TAPERPNT 27 (SUTURE) ×1 IMPLANT
SUTURE STRATFX PDO 1 14 VIOLET (SUTURE) ×1 IMPLANT
TRAY FOLEY MTR SLVR 16FR STAT (SET/KITS/TRAYS/PACK) IMPLANT
TUBE SUCTION HIGH CAP CLEAR NV (SUCTIONS) ×2 IMPLANT
WATER STERILE IRR 1000ML POUR (IV SOLUTION) ×4 IMPLANT
WRAP KNEE MAXI GEL POST OP (GAUZE/BANDAGES/DRESSINGS) ×1 IMPLANT

## 2021-06-29 NOTE — Anesthesia Procedure Notes (Signed)
Anesthesia Regional Block: Adductor canal block   Pre-Anesthetic Checklist: , timeout performed,  Correct Patient, Correct Site, Correct Laterality,  Correct Procedure, Correct Position, site marked,  Risks and benefits discussed,  Surgical consent,  Pre-op evaluation,  At surgeon's request and post-op pain management  Laterality: Left  Prep: chloraprep       Needles:  Injection technique: Single-shot  Needle Type: Echogenic Stimulator Needle     Needle Length: 5cm  Needle Gauge: 22     Additional Needles:   Procedures:, nerve stimulator,,, ultrasound used (permanent image in chart),,    Narrative:  Start time: 06/29/2021 8:00 AM End time: 06/29/2021 8:05 AM Injection made incrementally with aspirations every 5 mL.  Performed by: Personally   Additional Notes: Functioning IV was confirmed and monitors were applied.  A 30mm 22ga Arrow echogenic stimulator needle was used. Sterile prep and drape,hand hygiene and sterile gloves were used. Ultrasound guidance: relevant anatomy identified, needle position confirmed, local anesthetic spread visualized around nerve(s)., vascular puncture avoided.  Image printed for medical record. Negative aspiration and negative test dose prior to incremental administration of local anesthetic. The patient tolerated the procedure well.

## 2021-06-29 NOTE — Anesthesia Postprocedure Evaluation (Signed)
Anesthesia Post Note  Patient: [name]  Procedure(s) Performed: COMPUTER ASSISTED TOTAL KNEE ARTHROPLASTY (Left: Knee)     Patient location during evaluation: PACU Anesthesia Type: Regional, MAC and Spinal Level of consciousness: oriented and awake and alert Pain management: pain level controlled Vital Signs Assessment: post-procedure vital signs reviewed and stable Respiratory status: spontaneous breathing, respiratory function stable and patient connected to nasal cannula oxygen Cardiovascular status: blood pressure returned to baseline and stable Postop Assessment: no headache, no backache and no apparent nausea or vomiting Anesthetic complications: no   No notable events documented.  Last Vitals:  Vitals:   06/29/21 1200 06/29/21 1230  BP: 113/77 119/71  Pulse:  74  Resp: 18   Temp:    SpO2:  95%    Last Pain:  Vitals:   06/29/21 1145  TempSrc:   PainSc: 0-No pain                 [name]

## 2021-06-29 NOTE — Evaluation (Signed)
Physical Therapy Evaluation Patient Details Name: Lawrence Carlson MRN: [id number] DOB: 1955/07/24 Today's Date: 06/29/2021  History of Present Illness  66 y.o. male presented 06/29/21 for L TKA. PMH includes: DVT, anxiety, gout.  Clinical Impression  Pt is s/p TKA resulting in the deficits listed below (see PT Problem List). Pt reports sensation to LLE is intact to light touch, he is able to perform a straight leg raise without assistance and no quad lag, however in standing his LLE completely buckled and he had very poor gross motor control of LLE with attempted weight bearing and advancing of LLE. Will return to attempt ambulation later today after anesthesia has had more time to wear off. Initiated TKA HEP. Good progress expected.  Pt will benefit from skilled PT to increase their independence and safety with mobility to allow discharge to the venue listed below.         Recommendations for follow up therapy are one component of a multi-disciplinary discharge planning process, led by the attending physician.  Recommendations may be updated based on patient status, additional functional criteria and insurance authorization.  Follow Up Recommendations Outpatient PT    Assistance Recommended at Discharge Set up Supervision/Assistance  Patient can return home with the following  A little help with bathing/dressing/bathroom;Assist for transportation;Assistance with cooking/housework    Equipment Recommendations None recommended by PT  Recommendations for Other Services       Functional Status Assessment Patient has had a recent decline in their functional status and demonstrates the ability to make significant improvements in function in a reasonable and predictable amount of time.     Precautions / Restrictions Precautions Precautions: Knee Precaution Booklet Issued: Yes (comment) Precaution Comments: reviewed no pillow under knee Restrictions Weight Bearing Restrictions: No       Mobility  Bed Mobility Overal bed mobility: Modified Independent             General bed mobility comments: HOB up    Transfers Overall transfer level: Needs assistance Equipment used: Rolling walker (2 wheels) Transfers: Sit to/from Stand Sit to Stand: Min assist           General transfer comment: min A for balance due to LLE buckling; pt unable to weight bear thru LLE without it buckling, stated he can't feel where his leg is (sensation intact to light touch prior to standing), poor coordination of LLE, returned to supine    Ambulation/Gait               General Gait Details: deferred 2*  Stairs            Wheelchair Mobility    Modified Rankin (Stroke Patients Only)       Balance Overall balance assessment: Needs assistance   Sitting balance-Leahy Scale: Good     Standing balance support: Bilateral upper extremity supported Standing balance-Leahy Scale: Poor                               Pertinent Vitals/Pain Pain Assessment Pain Assessment: No/denies pain    Home Living Family/patient expects to be discharged to:: Private residence Living Arrangements: Spouse/significant other Available Help at Discharge: Family;Available 24 hours/day   Home Access: Level entry       Home Layout: One level Home Equipment: Agricultural consultant (2 wheels)      Prior Function Prior Level of Function : Independent/Modified Independent  Mobility Comments: walked without AD       Hand Dominance        Extremity/Trunk Assessment   Upper Extremity Assessment Upper Extremity Assessment: Overall WFL for tasks assessed    Lower Extremity Assessment Lower Extremity Assessment: LLE deficits/detail LLE Deficits / Details: SLR 3/5, knee ext 3/5 LLE Sensation: WNL LLE Coordination: decreased gross motor    Cervical / Trunk Assessment Cervical / Trunk Assessment: Normal  Communication   Communication: No difficulties   Cognition Arousal/Alertness: Awake/alert Behavior During Therapy: WFL for tasks assessed/performed Overall Cognitive Status: Within Functional Limits for tasks assessed                                          General Comments      Exercises Total Joint Exercises Ankle Circles/Pumps: AROM, Both, 10 reps, Supine Quad Sets: AROM, Left, 5 reps, Supine Heel Slides: AROM, Left, 10 reps, Supine Hip ABduction/ADduction: AROM, Left, 10 reps, Supine Straight Leg Raises: AROM, Left, 10 reps, Supine Long Arc Quad: AROM, Left, 5 reps, Seated Goniometric ROM: 0-75* AAROM L knee   Assessment/Plan    PT Assessment Patient needs continued PT services  PT Problem List Decreased mobility;Decreased activity tolerance;Decreased balance;Decreased knowledge of use of DME       PT Treatment Interventions Gait training;DME instruction;Therapeutic exercise;Patient/family education;Functional mobility training;Balance training    PT Goals (Current goals can be found in the Care Plan section)  Acute Rehab PT Goals Patient Stated Goal: to be able to walk PT Goal Formulation: With patient Time For Goal Achievement: 07/06/21 Potential to Achieve Goals: Good    Frequency 7X/week     Co-evaluation               AM-PAC PT "6 Clicks" Mobility  Outcome Measure Help needed turning from your back to your side while in a flat bed without using bedrails?: None Help needed moving from lying on your back to sitting on the side of a flat bed without using bedrails?: None Help needed moving to and from a bed to a chair (including a wheelchair)?: A Little Help needed standing up from a chair using your arms (e.g., wheelchair or bedside chair)?: A Little Help needed to walk in hospital room?: Total Help needed climbing 3-5 steps with a railing? : Total 6 Click Score: 16    End of Session Equipment Utilized During Treatment: Gait belt Activity Tolerance: Treatment limited secondary to  medical complications (Comment) Patient left: in bed;with call bell/phone within reach Nurse Communication: Mobility status PT Visit Diagnosis: Difficulty in walking, not elsewhere classified (R26.2);Unsteadiness on feet (R26.81)    Time: 6301-6010 PT Time Calculation (min) (ACUTE ONLY): 14 min   Charges:   PT Evaluation $PT Eval Moderate Complexity: 1 Mod        Tamala Ser PT 06/29/2021  Acute Rehabilitation Services Pager [phone] Office [phone]

## 2021-06-29 NOTE — Op Note (Signed)
OPERATIVE REPORT  SURGEON: Rod Can, MD   ASSISTANT: Nehemiah Massed, PA-C  PREOPERATIVE DIAGNOSIS: Left knee arthritis.   POSTOPERATIVE DIAGNOSIS: Left knee arthritis.   PROCEDURE: Left total knee arthroplasty.   IMPLANTS: Stryker Triathlon CR femur, size 7. Stryker Tritanium tibia, size 6. X3 polyethelyene insert, size 9 mm, CR. 3 button asymmetric patella, size 38 mm.  ANESTHESIA:  MAC, Regional, and Spinal  TOURNIQUET TIME: Not utilized.   ESTIMATED BLOOD LOSS:-300 mL    ANTIBIOTICS: 2 g Ancef.  DRAINS: None.  COMPLICATIONS: None   CONDITION: PACU - hemodynamically stable.   BRIEF CLINICAL NOTE: Lawrence Carlson is a 66 y.o. male with a long-standing history of Right knee arthritis. After failing conservative management, the patient was indicated for total knee arthroplasty. The risks, benefits, and alternatives to the procedure were explained, and the patient elected to proceed.  PROCEDURE IN DETAIL: Adductor canal block was obtained in the pre-op holding area. Once inside the operative room, spinal anesthesia was obtained, and a foley catheter was inserted. The patient was then positioned, a nonsterile tourniquet was placed, and the lower extremity was prepped and draped in the normal sterile surgical fashion.  A time-out was called verifying side and site of surgery. The patient received IV antibiotics within 60 minutes of beginning the procedure. The tourniquet was not utilized.   An anterior approach to the knee was performed utilizing a midvastus arthrotomy. A medial release was performed and the patellar fat pad was excised. Stryker imageless navigation was used to cut the distal femur perpendicular to the mechanical axis. A freehand patellar resection was performed, and the patella was sized and prepared with 3 lug holes.  Nagivation was used to make a neutral proximal tibia resection, taking 3 mm of bone from the less affected medial side with 4 degrees of slope. The  menisci were excised. A spacer block was placed, and the alignment and balance in extension were confirmed.   The distal femur was sized using the 3-degree external rotation guide referencing the posterior femoral cortex. The appropriate 4-in-1 cutting block was pinned into place. Rotation was checked using Whiteside's line, the epicondylar axis, and then confirmed with a spacer block in flexion. The remaining femoral cuts were performed, taking care to protect the MCL.  The tibia was sized and the trial tray was pinned into place. The remaining trail components were inserted. The knee was stable to varus and valgus stress through a full range of motion. The patella tracked centrally, and the PCL was well balanced. The trial components were removed, and the proximal tibial surface was prepared. Final components were impacted into place. The knee was tested for a final time and found to be well balanced.   The wound was copiously irrigated with Irrisept solution and normal saline using pule lavage.  Marcaine solution was injected into the periarticular soft tissue.  The wound was closed in layers using #1 Vicryl and Stratafix for the fascia, 2-0 Vicryl for the subcutaneous fat, 2-0 Monocryl for the deep dermal layer, 3-0 running Monocryl subcuticular Stitch, and 4-0 Monocryl stay sutures at both ends of the wound. Dermabond was applied to the skin.  Once the glue was fully dried, an Aquacell Ag and compressive dressing were applied.  Tthe patient was transported to the recovery room in stable condition.  Sponge, needle, and instrument counts were correct at the end of the case x2.  The patient tolerated the procedure well and there were no known complications.  Please note that  a surgical assistant was a medical necessity for this procedure in order to perform it in a safe and expeditious manner. Surgical assistant was necessary to retract the ligaments and vital neurovascular structures to prevent injury to  them and also necessary for proper positioning of the limb to allow for anatomic placement of the prosthesis.

## 2021-06-29 NOTE — Anesthesia Procedure Notes (Signed)
Spinal  Patient location during procedure: OR Start time: 06/29/2021 8:35 AM End time: 06/29/2021 8:38 AM Reason for block: surgical anesthesia Staffing Anesthesiologist: Bethena Midget, MD Resident/CRNA: [place], Stephanie C, CRNA Preanesthetic Checklist Completed: patient identified, IV checked, site marked, risks and benefits discussed, surgical consent, monitors and equipment checked, pre-op evaluation and timeout performed Spinal Block Patient position: sitting Prep: DuraPrep Patient monitoring: heart rate, cardiac monitor, continuous pulse ox and blood pressure Approach: midline Location: L3-4 Injection technique: single-shot Needle Needle type: Sprotte  Needle gauge: 24 G Needle length: 9 cm Assessment Sensory level: T4 Events: CSF return and second provider

## 2021-06-29 NOTE — Progress Notes (Signed)
Physical Therapy Treatment Patient Details Name: Lawrence Carlson MRN: [id number] DOB: June 09, 1955 Today's Date: 06/29/2021   History of Present Illness 66 y.o. male presented 06/29/21 for L TKA. PMH includes: DVT, anxiety, gout.    PT Comments    Pt ambulated 180' with RW, no buckling of LLE and with much improved coordination of LLE. Completed instruction in TKA HEP. He is ready to DC home from a PT standpoint.     Recommendations for follow up therapy are one component of a multi-disciplinary discharge planning process, led by the attending physician.  Recommendations may be updated based on patient status, additional functional criteria and insurance authorization.  Follow Up Recommendations  Outpatient PT     Assistance Recommended at Discharge Set up Supervision/Assistance  Patient can return home with the following A little help with bathing/dressing/bathroom;Assist for transportation;Assistance with cooking/housework   Equipment Recommendations  None recommended by PT    Recommendations for Other Services       Precautions / Restrictions Precautions Precautions: Knee Precaution Booklet Issued: Yes (comment) Precaution Comments: reviewed no pillow under knee Restrictions Weight Bearing Restrictions: No Other Position/Activity Restrictions: WBAT     Mobility  Bed Mobility Overal bed mobility: Modified Independent             General bed mobility comments: HOB up    Transfers Overall transfer level: Needs assistance Equipment used: Rolling walker (2 wheels) Transfers: Sit to/from Stand Sit to Stand: Supervision           General transfer comment: VCs hand placement    Ambulation/Gait Ambulation/Gait assistance: Supervision Gait Distance (Feet): 180 Feet Assistive device: Rolling walker (2 wheels) Gait Pattern/deviations: Step-through pattern Gait velocity: increased     General Gait Details: steady, VCs to decrease velocity and for sequencing, no  buckling of LLE   Stairs             Wheelchair Mobility    Modified Rankin (Stroke Patients Only)       Balance Overall balance assessment: Needs assistance   Sitting balance-Leahy Scale: Good     Standing balance support: Bilateral upper extremity supported Standing balance-Leahy Scale: Fair                              Cognition Arousal/Alertness: Awake/alert Behavior During Therapy: WFL for tasks assessed/performed Overall Cognitive Status: Within Functional Limits for tasks assessed                                 General Comments: easily distracted, impulsive, requires redirection to attend to tasks        Exercises Total Joint Exercises Ankle Circles/Pumps: AROM, Both, 10 reps, Supine Quad Sets: AROM, Left, 5 reps, Supine Short Arc Quad: AROM, Left, 5 reps, Supine Heel Slides: AROM, Left, 10 reps, Supine Hip ABduction/ADduction: AROM, Left, 10 reps, Supine Straight Leg Raises: AROM, Left, 10 reps, Supine Long Arc Quad: AROM, Left, 5 reps, Seated Knee Flexion: AAROM, Left, 10 reps, Seated Goniometric ROM: 0-95* AAROM L knee    General Comments        Pertinent Vitals/Pain Pain Assessment Pain Assessment: No/denies pain    Home Living Family/patient expects to be discharged to:: Private residence Living Arrangements: Spouse/significant other Available Help at Discharge: Family;Available 24 hours/day   Home Access: Level entry       Home Layout: One level Home Equipment: Agricultural consultant (2  wheels)      Prior Function            PT Goals (current goals can now be found in the care plan section) Acute Rehab PT Goals Patient Stated Goal: to be able to walk PT Goal Formulation: With patient Time For Goal Achievement: 07/06/21 Potential to Achieve Goals: Good Progress towards PT goals: Progressing toward goals    Frequency    7X/week      PT Plan Current plan remains appropriate    Co-evaluation               AM-PAC PT "6 Clicks" Mobility   Outcome Measure  Help needed turning from your back to your side while in a flat bed without using bedrails?: None Help needed moving from lying on your back to sitting on the side of a flat bed without using bedrails?: None Help needed moving to and from a bed to a chair (including a wheelchair)?: A Little Help needed standing up from a chair using your arms (e.g., wheelchair or bedside chair)?: A Little Help needed to walk in hospital room?: A Little Help needed climbing 3-5 steps with a railing? : A Little 6 Click Score: 20    End of Session Equipment Utilized During Treatment: Gait belt Activity Tolerance: Patient tolerated treatment well Patient left: in bed;with call bell/phone within reach Nurse Communication: Mobility status PT Visit Diagnosis: Difficulty in walking, not elsewhere classified (R26.2);Unsteadiness on feet (R26.81)     Time: 9629-5284 PT Time Calculation (min) (ACUTE ONLY): 15 min  Charges:  $Gait Training: 8-22 mins                     Ralene Bathe Kistler PT 06/29/2021  Acute Rehabilitation Services Pager [phone] Office [phone]

## 2021-06-29 NOTE — Transfer of Care (Signed)
Immediate Anesthesia Transfer of Care Note  Patient: Lawrence Carlson  Procedure(s) Performed: COMPUTER ASSISTED TOTAL KNEE ARTHROPLASTY (Left: Knee)  Patient Location: PACU  Anesthesia Type:Spinal  Level of Consciousness: awake and patient cooperative  Airway & Oxygen Therapy: Patient Spontanous Breathing and Patient connected to face mask oxygen  Post-op Assessment: Report given to RN and Post -op Vital signs reviewed and stable  Post vital signs: Reviewed and stable  Last Vitals:  Vitals Value Taken Time  BP 98/87 06/29/21 1102  Temp    Pulse 71 06/29/21 1104  Resp 12 06/29/21 1104  SpO2 94 % 06/29/21 1104  Vitals shown include unvalidated device data.  Last Pain:  Vitals:   06/29/21 0706  TempSrc: Oral         Complications: No notable events documented.

## 2021-06-29 NOTE — Care Plan (Signed)
Ortho Bundle Case Management Note  Patient Details  Name: Lawrence Carlson MRN: [id number] Date of Birth: [birth year]                  L TKA on 05-25-21 DCP: Home with wife DME: RW ordered through [facility] PT: outside facility   DME Arranged:  Walker rolling DME Agency:  [employer]  HH Arranged:    [place] Agency:     Additional Comments: Please contact me with any questions of if this plan should need to change.  Marianne Sofia, RN,CCM [facility]  [phone] 06/29/2021, 9:56 AM

## 2021-06-29 NOTE — Anesthesia Procedure Notes (Signed)
Procedure Name: MAC Date/Time: 06/29/2021 8:37 AM Performed by: Renato Shin, CRNA Pre-anesthesia Checklist: Patient identified, Emergency Drugs available, Suction available and Patient being monitored Patient Re-evaluated:Patient Re-evaluated prior to induction Oxygen Delivery Method: Simple face mask Preoxygenation: Pre-oxygenation with 100% oxygen Induction Type: IV induction Placement Confirmation: positive ETCO2 and breath sounds checked- equal and bilateral Dental Injury: Teeth and Oropharynx as per pre-operative assessment

## 2021-06-29 NOTE — Discharge Instructions (Signed)
 Dr. Deane Wattenbarger Total Joint Specialist [facility] [address] [place], [place] [address] [phone]  TOTAL KNEE REPLACEMENT POSTOPERATIVE DIRECTIONS    Knee Rehabilitation, Guidelines Following Surgery  Results after knee surgery are often greatly improved when you follow the exercise, range of motion and muscle strengthening exercises prescribed by your doctor. Safety measures are also important to protect the knee from further injury. Any time any of these exercises cause you to have increased pain or swelling in your knee joint, decrease the amount until you are comfortable again and slowly increase them. If you have problems or questions, call your caregiver or physical therapist for advice.   WEIGHT BEARING Weight bearing as tolerated with assist device (walker, cane, etc) as directed, use it as long as suggested by your surgeon or therapist, typically at least 4-6 weeks.  HOME CARE INSTRUCTIONS  Remove items at home which could result in a fall. This includes throw rugs or furniture in walking pathways.  Continue medications as instructed at time of discharge. You may have some home medications which will be placed on hold until you complete the course of blood thinner medication.  You may start showering once you are discharged home but do not submerge the incision under water. Just pat the incision dry and apply a dry gauze dressing on daily. Walk with walker as instructed.  You may resume a sexual relationship in one month or when given the OK by your doctor.  Use walker as long as suggested by your caregivers. Avoid periods of inactivity such as sitting longer than an hour when not asleep. This helps prevent blood clots.  You may put full weight on your legs and walk as much as is comfortable.  You may return to work once you are cleared by your doctor.  Do not drive a car for 6 weeks or until released by you surgeon.  Do not drive while  taking narcotics.  Wear the elastic stockings for three weeks following surgery during the day but you may remove then at night. Make sure you keep all of your appointments after your operation with all of your doctors and caregivers. You should call the office at the above phone number and make an appointment for approximately two weeks after the date of your surgery. Do not remove your surgical dressing. The dressing is waterproof; you may take showers in 3 days, but do not take tub baths or submerge the dressing. Please pick up a stool softener and laxative for home use as long as you are requiring pain medications. ICE to the affected knee every three hours for 30 minutes at a time and then as needed for pain and swelling.  Continue to use ice on the knee for pain and swelling from surgery. You may notice swelling that will progress down to the foot and ankle.  This is normal after surgery.  Elevate the leg when you are not up walking on it.   It is important for you to complete the blood thinner medication as prescribed by your doctor. Continue to use the breathing machine which will help keep your temperature down.  It is common for your temperature to cycle up and down following surgery, especially at night when you are not up moving around and exerting yourself.  The breathing machine keeps your lungs expanded and your temperature down.  RANGE OF MOTION AND STRENGTHENING EXERCISES  Rehabilitation of the knee is important following a knee injury or an   operation. After just a few days of immobilization, the muscles of the thigh which control the knee become weakened and shrink (atrophy). Knee exercises are designed to build up the tone and strength of the thigh muscles and to improve knee motion. Often times heat used for twenty to thirty minutes before working out will loosen up your tissues and help with improving the range of motion but do not use heat for the first two weeks following surgery.  These exercises can be done on a training (exercise) mat, on the floor, on a table or on a bed. Use what ever works the best and is most comfortable for you Knee exercises include:  Leg Lifts - While your knee is still immobilized in a splint or cast, you can do straight leg raises. Lift the leg to 60 degrees, hold for 3 sec, and slowly lower the leg. Repeat 10-20 times 2-3 times daily. Perform this exercise against resistance later as your knee gets better.  Quad and Hamstring Sets - Tighten up the muscle on the front of the thigh (Quad) and hold for 5-10 sec. Repeat this 10-20 times hourly. Hamstring sets are done by pushing the foot backward against an object and holding for 5-10 sec. Repeat as with quad sets.  A rehabilitation program following serious knee injuries can speed recovery and prevent re-injury in the future due to weakened muscles. Contact your doctor or a physical therapist for more information on knee rehabilitation.   POST-OPERATIVE OPIOID TAPER INSTRUCTIONS: It is important to wean off of your opioid medication as soon as possible. If you do not need pain medication after your surgery it is ok to stop day one. Opioids include: Codeine, Hydrocodone(Norco, Vicodin), Oxycodone(Percocet, oxycontin) and hydromorphone amongst others.  Long term and even short term use of opiods can cause: Increased pain response Dependence Constipation Depression Respiratory depression And more.  Withdrawal symptoms can include Flu like symptoms Nausea, vomiting And more Techniques to manage these symptoms Hydrate well Eat regular healthy meals Stay active Use relaxation techniques(deep breathing, meditating, yoga) Do Not substitute Alcohol to help with tapering If you have been on opioids for less than two weeks and do not have pain than it is ok to stop all together.  Plan to wean off of opioids This plan should start within one week post op of your joint replacement. Maintain the same  interval or time between taking each dose and first decrease the dose.  Cut the total daily intake of opioids by one tablet each day Next start to increase the time between doses. The last dose that should be eliminated is the evening dose.    SKILLED REHAB INSTRUCTIONS: If the patient is transferred to a skilled rehab facility following release from the hospital, a list of the current medications will be sent to the facility for the patient to continue.  When discharged from the skilled rehab facility, please have the facility set up the patient's Home Health Physical Therapy prior to being released. Also, the skilled facility will be responsible for providing the patient with their medications at time of release from the facility to include their pain medication, the muscle relaxants, and their blood thinner medication. If the patient is still at the rehab facility at time of the two week follow up appointment, the skilled rehab facility will also need to assist the patient in arranging follow up appointment in our office and any transportation needs.  MAKE SURE YOU:  Understand these instructions.  Will watch   your condition.  Will get help right away if you are not doing well or get worse.    Pick up stool softner and laxative for home use following surgery while on pain medications. Do NOT remove your dressing. You may shower.  Do not take tub baths or submerge incision under water. May shower starting three days after surgery. Please use a clean towel to pat the incision dry following showers. Continue to use ice for pain and swelling after surgery. Do not use any lotions or creams on the incision until instructed by your surgeon.  

## 2021-06-29 NOTE — H&P (Signed)
TOTAL KNEE ADMISSION H&P  Patient is being admitted for left total knee arthroplasty.  Subjective:  Chief Complaint:left knee pain.  HPI: Jrue Herder, 66 y.o. male, has a history of pain and functional disability in the left knee due to arthritis and has failed non-surgical conservative treatments for greater than 12 weeks to includeNSAID's and/or analgesics and activity modification.  Onset of symptoms was gradual, starting 1 years ago with gradually worsening course since that time. The patient noted no past surgery on the left knee(s).  Patient currently rates pain in the left knee(s) at 1 out of 10 with activity. Patient has worsening of pain with activity and weight bearing, pain that interferes with activities of daily living, and pain with passive range of motion.  Patient has evidence of subchondral cysts, subchondral sclerosis, and joint space narrowing by imaging studies. There is no active infection.  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      Cholecalciferol (VITAMIN D) 50 MCG (2000 UT) tablet Take 2,000 Units by mouth daily.      Coenzyme Q10 (CO Q10) 200 MG CAPS Take 200 mg by mouth daily. (Patient not taking: Reported on 03/22/2021)      Flaxseed, Linseed, (FLAX SEED OIL) 1000 MG CAPS Take 1,000 mg by mouth daily. (Patient not taking: No sig reported)      Magnesium 250 MG TABS Take 250 mg by mouth daily.      meloxicam (MOBIC) 15 MG tablet Take 15 mg by mouth daily. (Patient not taking: No sig reported)      Misc Natural Products (SUPER GREENS) POWD Take 1 Scoop by mouth daily. Mix with shake daily ( Green blend super food) (Patient not taking: Reported on 03/22/2021)      Multiple Vitamin (MULTIVITAMIN WITH MINERALS) TABS tablet Take 1 tablet by mouth daily.      omeprazole (PRILOSEC) 40 MG capsule Take 40 mg by mouth daily. (Patient not taking: Reported on 03/22/2021)      OVER THE COUNTER MEDICATION Take 237 mLs by mouth daily. Gochi drink      traZODone (DESYREL) 50 MG tablet Take 50 mg by mouth at bedtime as needed for sleep (do not take on same night as ambien).      vitamin B-12 (CYANOCOBALAMIN) 1000 MCG tablet Take 5,000 mcg by mouth daily.      No current facility-administered medications for this visit.   No Known Allergies  Social History   Tobacco   Use   Smoking status: Never   Smokeless tobacco: Never  Substance Use Topics   Alcohol use: Never    Family History  Problem Relation Age of Onset   Alcohol abuse Mother    Alcohol abuse Father      Review of Systems  Musculoskeletal:  Positive for arthralgias.  All other systems reviewed and are negative.  Objective:  Physical Exam Constitutional:      Appearance: Normal appearance. He is normal weight.  HENT:     Head: Normocephalic.  Eyes:     Pupils: Pupils are equal, round, and reactive to light.  Cardiovascular:     Rate and Rhythm: Normal rate.  Pulmonary:     Effort: Pulmonary effort is normal.  Abdominal:     Palpations:  Abdomen is soft.  Genitourinary:    Comments: Deferred Musculoskeletal:        General: Tenderness present.     Cervical back: Normal range of motion.  Skin:    General: Skin is warm.  Neurological:     Mental Status: He is alert and oriented to person, place, and time.  Psychiatric:        Behavior: Behavior normal.    Vital signs in last 24 hours: @VSRANGES@  Labs:   Estimated body mass index is 26.64 kg/m as calculated from the following:   Height as of 10/07/20: 5' 11" (1.803 m).   Weight as of 10/07/20: 86.6 kg.   Imaging Review Plain radiographs demonstrate severe degenerative joint disease of the left knee(s). The bone quality appears to be adequate for age and reported activity level.      Assessment/Plan:  End stage arthritis, left knee   The patient history, physical examination, clinical judgment of the provider and imaging studies are consistent with end stage degenerative joint disease of the left knee(s) and total knee arthroplasty is deemed medically necessary. The treatment options including medical management, injection therapy arthroscopy and arthroplasty were discussed at length. The risks and benefits of total knee arthroplasty were presented and reviewed. The risks due to aseptic loosening, infection, stiffness, patella tracking problems, thromboembolic complications and other imponderables were discussed. The patient acknowledged the explanation, agreed to proceed with the plan and consent was signed. Patient is being admitted for inpatient treatment for surgery, pain control, PT, OT, prophylactic antibiotics, VTE prophylaxis, progressive ambulation and ADL's and discharge planning. The patient is planning to be discharged  home same day     Patient's anticipated LOS is less than 2 midnights, meeting these requirements: - Younger than 65 - Lives within 1 hour of care - Has a competent adult at home to recover with post-op recover - NO history of  -  Chronic pain requiring opiods  - Diabetes  - Coronary Artery Disease  - Heart failure  - Heart attack  - Stroke  - DVT/VTE  - Cardiac arrhythmia  - Respiratory Failure/COPD  - Renal failure  - Anemia  - Advanced Liver disease     Coronary Artery Disease             - Heart failure             - Heart attack             - Stroke             - DVT/VTE             - Cardiac arrhythmia             - Respiratory Failure/COPD             - Renal failure             - Anemia             - Advanced Liver disease

## 2021-06-30 ENCOUNTER — Encounter (HOSPITAL_COMMUNITY): Payer: Self-pay | Admitting: Orthopedic Surgery

## 2021-07-02 ENCOUNTER — Emergency Department (HOSPITAL_BASED_OUTPATIENT_CLINIC_OR_DEPARTMENT_OTHER): Payer: Medicare HMO

## 2021-07-02 ENCOUNTER — Other Ambulatory Visit: Payer: Self-pay

## 2021-07-02 ENCOUNTER — Emergency Department (HOSPITAL_BASED_OUTPATIENT_CLINIC_OR_DEPARTMENT_OTHER)
Admission: EM | Admit: 2021-07-02 | Discharge: 2021-07-03 | Disposition: A | Discharge status: Home / Self Care | Payer: Medicare HMO | Attending: Emergency Medicine | Admitting: Emergency Medicine

## 2021-07-02 ENCOUNTER — Emergency Department (HOSPITAL_BASED_OUTPATIENT_CLINIC_OR_DEPARTMENT_OTHER): Payer: Medicare HMO | Admitting: Radiology

## 2021-07-02 ENCOUNTER — Encounter (HOSPITAL_BASED_OUTPATIENT_CLINIC_OR_DEPARTMENT_OTHER): Payer: Self-pay | Admitting: Emergency Medicine

## 2021-07-02 DIAGNOSIS — M79605 Pain in left leg: Secondary | ICD-10-CM | POA: Insufficient documentation

## 2021-07-02 DIAGNOSIS — Z7901 Long term (current) use of anticoagulants: Secondary | ICD-10-CM | POA: Insufficient documentation

## 2021-07-02 DIAGNOSIS — Z20822 Contact with and (suspected) exposure to covid-19: Secondary | ICD-10-CM | POA: Diagnosis not present

## 2021-07-02 DIAGNOSIS — Z96652 Presence of left artificial knee joint: Secondary | ICD-10-CM | POA: Insufficient documentation

## 2021-07-02 DIAGNOSIS — R5383 Other fatigue: Secondary | ICD-10-CM | POA: Insufficient documentation

## 2021-07-02 DIAGNOSIS — X58XXXA Exposure to other specified factors, initial encounter: Secondary | ICD-10-CM | POA: Diagnosis not present

## 2021-07-02 DIAGNOSIS — R6883 Chills (without fever): Secondary | ICD-10-CM | POA: Insufficient documentation

## 2021-07-02 DIAGNOSIS — S8992XA Unspecified injury of left lower leg, initial encounter: Secondary | ICD-10-CM | POA: Diagnosis present

## 2021-07-02 DIAGNOSIS — S8002XA Contusion of left knee, initial encounter: Secondary | ICD-10-CM | POA: Insufficient documentation

## 2021-07-02 LAB — COMPREHENSIVE METABOLIC PANEL
ALT: 33 U/L (ref 0–44)
AST: 36 U/L (ref 15–41)
Albumin: 3.3 g/dL — ABNORMAL LOW (ref 3.5–5.0)
Alkaline Phosphatase: 63 U/L (ref 38–126)
Anion gap: 8 (ref 5–15)
BUN: 17 mg/dL (ref 8–23)
CO2: 26 mmol/L (ref 22–32)
Calcium: 8.6 mg/dL — ABNORMAL LOW (ref 8.9–10.3)
Chloride: 102 mmol/L (ref 98–111)
Creatinine, Ser: 0.89 mg/dL (ref 0.61–1.24)
GFR, Estimated: >60 mL/min (ref >60)
Glucose, Bld: 121 mg/dL — ABNORMAL HIGH (ref 70–99)
Potassium: 4.1 mmol/L (ref 3.5–5.1)
Sodium: 136 mmol/L (ref 135–145)
Total Bilirubin: 0.8 mg/dL (ref 0.3–1.2)
Total Protein: 6.4 g/dL — ABNORMAL LOW (ref 6.5–8.1)

## 2021-07-02 LAB — CBC WITH DIFFERENTIAL/PLATELET
Abs Immature Granulocytes: 0.01 10*3/uL (ref 0.00–0.07)
Basophils Absolute: 0 10*3/uL (ref 0.0–0.1)
Basophils Relative: 0 %
Eosinophils Absolute: 0 10*3/uL (ref 0.0–0.5)
Eosinophils Relative: 0 %
HCT: 30.3 % — ABNORMAL LOW (ref 39.0–52.0)
Hemoglobin: 10.3 g/dL — ABNORMAL LOW (ref 13.0–17.0)
Immature Granulocytes: 0 %
Lymphocytes Relative: 12 %
Lymphs Abs: 1 10*3/uL (ref 0.7–4.0)
MCH: 31.6 pg (ref 26.0–34.0)
MCHC: 34 g/dL (ref 30.0–36.0)
MCV: 92.9 fL (ref 80.0–100.0)
Monocytes Absolute: 1 10*3/uL (ref 0.1–1.0)
Monocytes Relative: 13 %
Neutro Abs: 6.2 10*3/uL (ref 1.7–7.7)
Neutrophils Relative %: 75 %
Platelets: 194 10*3/uL (ref 150–400)
RBC: 3.26 MIL/uL — ABNORMAL LOW (ref 4.22–5.81)
RDW: 12.6 % (ref 11.5–15.5)
WBC: 8.2 10*3/uL (ref 4.0–10.5)
nRBC: 0 % (ref 0.0–0.2)

## 2021-07-02 LAB — URINALYSIS, ROUTINE W REFLEX MICROSCOPIC
Bilirubin Urine: NEGATIVE
Glucose, UA: NEGATIVE mg/dL
Ketones, ur: NEGATIVE mg/dL
Leukocytes,Ua: NEGATIVE
Nitrite: NEGATIVE
Protein, ur: 30 mg/dL — AB
Specific Gravity, Urine: 1.02 (ref 1.005–1.030)
pH: 6.5 (ref 5.0–8.0)

## 2021-07-02 LAB — RESP PANEL BY RT-PCR (FLU A&B, COVID) ARPGX2
Influenza A by PCR: NEGATIVE
Influenza B by PCR: NEGATIVE
SARS Coronavirus 2 by RT PCR: NEGATIVE

## 2021-07-02 LAB — LACTIC ACID, PLASMA
Lactic Acid, Venous: 1 mmol/L (ref 0.5–1.9)
Lactic Acid, Venous: 0.9 mmol/L (ref 0.5–1.9)

## 2021-07-02 MED ORDER — ALUM & MAG HYDROXIDE-SIMETH 200-200-20 MG/5ML PO SUSP
30.0000 mL | Freq: Once | ORAL | Status: AC
  Administered 2021-07-02: 30 mL via ORAL
  Filled 2021-07-02: qty 30

## 2021-07-02 NOTE — Discharge Instructions (Signed)
Watch for other signs of infection.  Watch for marked difficulty breathing.  Follow-up with Dr. Lyla Glassing as needed.

## 2021-07-02 NOTE — ED Provider Notes (Signed)
[facility] EMERGENCY DEPT Provider Note   CSN: [id number] Arrival date & time: 07/02/21  1926     History  Chief Complaint  Patient presents with   Knee Pain    Lawrence Carlson is a 66 y.o. male.   Knee Pain Associated symptoms: no back pain   Patient presents with chills.  3 days ago had left-sided total knee replacement by Dr. Linna Caprice.  States he feels that the leg is more swollen.  States more redness.  No difficulty breathing.  No cough.  No dysuria.  States he does ache all over.  States he did take the dressing off the first day and take a shower when she found out he was not supposed to do.  No abdominal pain.  No difficulty breathing.  Does feel somewhat more fatigued however.  Is on Eliquis for DVT prophylaxis.    Home Medications Prior to Admission medications   Medication Sig Start Date End Date Taking? Authorizing Provider  ALPRAZolam Prudy Feeler) 0.5 MG tablet Take 0.5 mg by mouth at bedtime. 08/31/20   [provider]  apixaban (ELIQUIS) 2.5 MG TABS tablet Take 1 tablet (2.5 mg total) by mouth 2 (two) times daily. 06/29/21 07/29/21  Swinteck, Arlys John, MD  Cyanocobalamin (B-12 PO) Take 1 tablet by mouth daily.    [provider]  docusate sodium (COLACE) 100 MG capsule Take 1 capsule (100 mg total) by mouth 2 (two) times daily. 06/29/21 08/28/21  Swinteck, Arlys John, MD  HYDROcodone-acetaminophen (NORCO) 5-325 MG tablet Take 1 tablet by mouth every 4 (four) hours as needed for moderate pain. 06/29/21   Swinteck, Arlys John, MD  methocarbamol (ROBAXIN) 500 MG tablet Take 1 tablet (500 mg total) by mouth every 6 (six) hours as needed for muscle spasms. 06/29/21   Swinteck, Arlys John, MD  Multiple Vitamin (MULTIVITAMIN WITH MINERALS) TABS tablet Take 2 tablets by mouth daily.    [provider]  ondansetron (ZOFRAN) 4 MG tablet Take 1 tablet (4 mg total) by mouth every 8 (eight) hours as needed for nausea or vomiting. 06/29/21   Swinteck, Arlys John, MD  senna (SENOKOT) 8.6  MG TABS tablet Take 2 tablets (17.2 mg total) by mouth at bedtime. 06/29/21 08/28/21  Swinteck, Arlys John, MD  traZODone (DESYREL) 100 MG tablet Take 100 mg by mouth at bedtime.    [provider]      Allergies    Patient has no known allergies.    Review of Systems   Review of Systems  Constitutional:  Positive for chills.  Respiratory:  Negative for shortness of breath.   Cardiovascular:  Negative for chest pain.  Gastrointestinal:  Negative for abdominal pain.  Musculoskeletal:  Negative for back pain.  Neurological:  Negative for weakness.   Physical Exam Updated Vital Signs BP 115/75 (BP Location: Right Arm)    Pulse 87    Temp 99 F (37.2 C)    Resp 18    Ht 5\' 11"  (1.803 m)    Wt 84.4 kg    SpO2 99%    BMI 25.94 kg/m  Physical Exam Vitals and nursing note reviewed.  HENT:     Head: Normocephalic.  Cardiovascular:     Rate and Rhythm: Regular rhythm.  Pulmonary:     Breath sounds: No wheezing or rhonchi.  Musculoskeletal:     Left lower leg: Edema present.     Comments: Edema to left lower extremity.  Postsurgical dressings intact.  Some ecchymosis.  Both lower extremities warm but left may be  warmer than right  Neurological:     Mental Status: He is alert.       ED Results / Procedures / Treatments   Labs (all labs ordered are listed, but only abnormal results are displayed) Labs Reviewed  COMPREHENSIVE METABOLIC PANEL - Abnormal; Notable for the following components:      Result Value   Glucose, Bld 121 (*)    Calcium 8.6 (*)    Total Protein 6.4 (*)    Albumin 3.3 (*)    All other components within normal limits  CBC WITH DIFFERENTIAL/PLATELET - Abnormal; Notable for the following components:   RBC 3.26 (*)    Hemoglobin 10.3 (*)    HCT 30.3 (*)    All other components within normal limits  URINALYSIS, ROUTINE W REFLEX MICROSCOPIC - Abnormal; Notable for the following components:   Hgb urine dipstick TRACE (*)    Protein, ur 30 (*)    Bacteria, UA  FEW (*)    All other components within normal limits  RESP PANEL BY RT-PCR (FLU A&B, COVID) ARPGX2  LACTIC ACID, PLASMA  LACTIC ACID, PLASMA    EKG None  Radiology DG Chest 2 View  Result Date: 07/02/2021 CLINICAL DATA:  Suspected infection. EXAM: CHEST - 2 VIEW COMPARISON:  03/11/2017 FINDINGS: Heart and mediastinal contours are within normal limits. No focal opacities or effusions. No acute bony abnormality. IMPRESSION: No active cardiopulmonary disease. Electronically Signed   By: Charlett NoseKevin  Dover M.D.   On: 07/02/2021 20:16   US Venous Img Lower Unilateral Left  Result Date: 07/02/2021 CLINICAL DATA:  Left total knee arthroplasty 3 days ago. Now with swelling and pain at the incision. History of previous DVT. EXAM: Left LOWER EXTREMITY VENOUS DOPPLER ULTRASOUND TECHNIQUE: Gray-scale sonography with compression, as well as color and duplex ultrasound, were performed to evaluate the deep venous system(s) from the level of the common femoral vein through the popliteal and proximal calf veins. COMPARISON:  None. FINDINGS: VENOUS Normal compressibility of the common femoral, superficial femoral, and popliteal veins, as well as the visualized calf veins. Visualized portions of profunda femoral vein and great saphenous vein unremarkable. No filling defects to suggest DVT on grayscale or color Doppler imaging. Doppler waveforms show normal direction of venous flow, normal respiratory plasticity and response to augmentation. Limited views of the contralateral common femoral vein are unremarkable. OTHER In the medial left popliteal fossa, there is a complex fluid collection measuring 7.6 x 2.1 x 1.5 cm. Heterogeneous echogenic appearance. This is likely hematoma, possibly postoperative or hemorrhagic popliteal cyst. Limitations: none IMPRESSION: 1. No evidence of significant deep venous thrombosis in the visualized lower extremity veins. 2. Complex fluid collection, likely hematoma in the left popliteal fossa.  Electronically Signed   By: Burman NievesWilliam  Stevens M.D.   On: 07/02/2021 22:26    Procedures Procedures    Medications Ordered in ED Medications  alum & mag hydroxide-simeth (MAALOX/MYLANTA) 200-200-20 MG/5ML suspension 30 mL (has no administration in time range)    ED Course/ Medical Decision Making/ A&P                           Medical Decision Making Amount and/or Complexity of Data Reviewed Labs: ordered. Radiology: ordered. ECG/medicine tests: ordered.  Differential for chills postsurgery as long.  Potentially could be pneumonia.  Potentially could be UTI.  Potentially could be wound infection.  Could be separate viral infection also.  Lawrence GashMartin Jack Fausto Carlson Patient presents with chills.  Left leg swelling.  Does have recent left knee replacement.  No coughing.  States the leg is swollen.  Surgery done by Dr. Linna Caprice 3 days ago.  X-ray reassuring.  White count not elevated.  Not febrile here.  Has Doppler that does not show DVT.  No dysuria.  Knee x-ray reviewed by me.  Independently interpreted. Discussed with Dr. Aundria Rud from orthopedic surgery.  Likely not wound infection at this time.  COVID testing is done but has not resulted yet.  Lungs are clear.  No dysuria.  Can follow-up sooner with Dr. Linna Caprice if needed.  Will discharge home.         Final Clinical Impression(s) / ED Diagnoses Final diagnoses:  Chills    Rx / DC Orders ED Discharge Orders     None         Benjiman Core, MD 07/02/21 2344

## 2021-07-02 NOTE — ED Triage Notes (Signed)
Pt via pov from home after a knee replacement on Wednesday. Pt reports that it wasn't hurting until today, but it began hurting today, and became red and warm to the touch. Pt states he has had chills today as well. Pt alert & oriented, nad noted.

## 2021-09-20 ENCOUNTER — Telehealth: Payer: Medicare HMO | Admitting: Behavioral Health

## 2021-11-04 ENCOUNTER — Encounter (HOSPITAL_BASED_OUTPATIENT_CLINIC_OR_DEPARTMENT_OTHER): Payer: Self-pay | Admitting: *Deleted

## 2021-11-04 ENCOUNTER — Emergency Department (HOSPITAL_BASED_OUTPATIENT_CLINIC_OR_DEPARTMENT_OTHER)
Admission: EM | Admit: 2021-11-04 | Discharge: 2021-11-04 | Disposition: A | Discharge status: Home / Self Care | Payer: Medicare HMO | Attending: Emergency Medicine | Admitting: Emergency Medicine

## 2021-11-04 ENCOUNTER — Other Ambulatory Visit: Payer: Self-pay

## 2021-11-04 DIAGNOSIS — R1030 Lower abdominal pain, unspecified: Secondary | ICD-10-CM | POA: Diagnosis present

## 2021-11-04 DIAGNOSIS — Z5321 Procedure and treatment not carried out due to patient leaving prior to being seen by health care provider: Secondary | ICD-10-CM | POA: Insufficient documentation

## 2021-11-04 LAB — COMPREHENSIVE METABOLIC PANEL
ALT: 24 U/L (ref 0–44)
AST: 34 U/L (ref 15–41)
Albumin: 4.1 g/dL (ref 3.5–5.0)
Alkaline Phosphatase: 68 U/L (ref 38–126)
Anion gap: 9 (ref 5–15)
BUN: 14 mg/dL (ref 8–23)
CO2: 26 mmol/L (ref 22–32)
Calcium: 9.7 mg/dL (ref 8.9–10.3)
Chloride: 104 mmol/L (ref 98–111)
Creatinine, Ser: 0.93 mg/dL (ref 0.61–1.24)
GFR, Estimated: >60 mL/min (ref >60)
Glucose, Bld: 98 mg/dL (ref 70–99)
Potassium: 4.2 mmol/L (ref 3.5–5.1)
Sodium: 139 mmol/L (ref 135–145)
Total Bilirubin: 0.6 mg/dL (ref 0.3–1.2)
Total Protein: 7 g/dL (ref 6.5–8.1)

## 2021-11-04 LAB — CBC
HCT: 44 % (ref 39.0–52.0)
Hemoglobin: 14.9 g/dL (ref 13.0–17.0)
MCH: 30.1 pg (ref 26.0–34.0)
MCHC: 33.9 g/dL (ref 30.0–36.0)
MCV: 88.9 fL (ref 80.0–100.0)
Platelets: 173 10*3/uL (ref 150–400)
RBC: 4.95 MIL/uL (ref 4.22–5.81)
RDW: 13.4 % (ref 11.5–15.5)
WBC: 5.5 10*3/uL (ref 4.0–10.5)
nRBC: 0 % (ref 0.0–0.2)

## 2021-11-04 LAB — URINALYSIS, ROUTINE W REFLEX MICROSCOPIC
Bilirubin Urine: NEGATIVE
Glucose, UA: NEGATIVE mg/dL
Hgb urine dipstick: NEGATIVE
Ketones, ur: NEGATIVE mg/dL
Leukocytes,Ua: NEGATIVE
Nitrite: NEGATIVE
Protein, ur: NEGATIVE mg/dL
Specific Gravity, Urine: 1.013 (ref 1.005–1.030)
pH: 6.5 (ref 5.0–8.0)

## 2021-11-04 LAB — LIPASE, BLOOD: Lipase: 18 U/L (ref 11–51)

## 2021-11-04 NOTE — ED Notes (Signed)
Called pt 2x, no answer

## 2021-11-04 NOTE — ED Triage Notes (Signed)
Pt is here with abdominal pain since last Thursday.  Pt reports bloating and belching and constipation.  Pt was seen by PCP on Wednesday and ws dx with constipation

## 2021-11-06 ENCOUNTER — Emergency Department (HOSPITAL_BASED_OUTPATIENT_CLINIC_OR_DEPARTMENT_OTHER)
Admission: EM | Admit: 2021-11-06 | Discharge: 2021-11-06 | Disposition: A | Discharge status: Home / Self Care | Payer: Medicare HMO | Attending: Emergency Medicine | Admitting: Emergency Medicine

## 2021-11-06 ENCOUNTER — Emergency Department (HOSPITAL_BASED_OUTPATIENT_CLINIC_OR_DEPARTMENT_OTHER): Payer: Medicare HMO

## 2021-11-06 ENCOUNTER — Other Ambulatory Visit: Payer: Self-pay

## 2021-11-06 ENCOUNTER — Encounter (HOSPITAL_BASED_OUTPATIENT_CLINIC_OR_DEPARTMENT_OTHER): Payer: Self-pay | Admitting: Emergency Medicine

## 2021-11-06 DIAGNOSIS — Z7901 Long term (current) use of anticoagulants: Secondary | ICD-10-CM | POA: Insufficient documentation

## 2021-11-06 DIAGNOSIS — K5900 Constipation, unspecified: Secondary | ICD-10-CM | POA: Insufficient documentation

## 2021-11-06 DIAGNOSIS — R1032 Left lower quadrant pain: Secondary | ICD-10-CM

## 2021-11-06 LAB — COMPREHENSIVE METABOLIC PANEL
ALT: 26 U/L (ref 0–44)
AST: 29 U/L (ref 15–41)
Albumin: 3.8 g/dL (ref 3.5–5.0)
Alkaline Phosphatase: 69 U/L (ref 38–126)
Anion gap: 5 (ref 5–15)
BUN: 15 mg/dL (ref 8–23)
CO2: 27 mmol/L (ref 22–32)
Calcium: 9.3 mg/dL (ref 8.9–10.3)
Chloride: 106 mmol/L (ref 98–111)
Creatinine, Ser: 1.1 mg/dL (ref 0.61–1.24)
GFR, Estimated: >60 mL/min (ref >60)
Glucose, Bld: 110 mg/dL — ABNORMAL HIGH (ref 70–99)
Potassium: 4.4 mmol/L (ref 3.5–5.1)
Sodium: 138 mmol/L (ref 135–145)
Total Bilirubin: 0.7 mg/dL (ref 0.3–1.2)
Total Protein: 7 g/dL (ref 6.5–8.1)

## 2021-11-06 LAB — CBC WITH DIFFERENTIAL/PLATELET
Abs Immature Granulocytes: 0.02 10*3/uL (ref 0.00–0.07)
Basophils Absolute: 0 10*3/uL (ref 0.0–0.1)
Basophils Relative: 1 %
Eosinophils Absolute: 0 10*3/uL (ref 0.0–0.5)
Eosinophils Relative: 1 %
HCT: 44.9 % (ref 39.0–52.0)
Hemoglobin: 15.5 g/dL (ref 13.0–17.0)
Immature Granulocytes: 0 %
Lymphocytes Relative: 16 %
Lymphs Abs: 1.1 10*3/uL (ref 0.7–4.0)
MCH: 31 pg (ref 26.0–34.0)
MCHC: 34.5 g/dL (ref 30.0–36.0)
MCV: 89.8 fL (ref 80.0–100.0)
Monocytes Absolute: 0.5 10*3/uL (ref 0.1–1.0)
Monocytes Relative: 8 %
Neutro Abs: 5 10*3/uL (ref 1.7–7.7)
Neutrophils Relative %: 74 %
Platelets: 196 10*3/uL (ref 150–400)
RBC: 5 MIL/uL (ref 4.22–5.81)
RDW: 13.9 % (ref 11.5–15.5)
WBC: 6.6 10*3/uL (ref 4.0–10.5)
nRBC: 0 % (ref 0.0–0.2)

## 2021-11-06 LAB — URINALYSIS, ROUTINE W REFLEX MICROSCOPIC
Bilirubin Urine: NEGATIVE
Glucose, UA: NEGATIVE mg/dL
Hgb urine dipstick: NEGATIVE
Ketones, ur: NEGATIVE mg/dL
Leukocytes,Ua: NEGATIVE
Nitrite: NEGATIVE
Protein, ur: NEGATIVE mg/dL
Specific Gravity, Urine: 1.02 (ref 1.005–1.030)
pH: 8 (ref 5.0–8.0)

## 2021-11-06 LAB — LIPASE, BLOOD: Lipase: 26 U/L (ref 11–51)

## 2021-11-06 MED ORDER — IOHEXOL 300 MG/ML  SOLN
100.0000 mL | Freq: Once | INTRAMUSCULAR | Status: AC | PRN
  Administered 2021-11-06: 100 mL via INTRAVENOUS

## 2021-11-06 MED ORDER — LIDOCAINE VISCOUS HCL 2 % MT SOLN
15.0000 mL | Freq: Once | OROMUCOSAL | Status: AC
  Administered 2021-11-06: 15 mL via ORAL
  Filled 2021-11-06: qty 15

## 2021-11-06 MED ORDER — ALUM & MAG HYDROXIDE-SIMETH 200-200-20 MG/5ML PO SUSP
30.0000 mL | Freq: Once | ORAL | Status: AC
  Administered 2021-11-06: 30 mL via ORAL
  Filled 2021-11-06: qty 30

## 2021-11-06 NOTE — Discharge Instructions (Signed)
Your labs and CT were overall very reassuring.  There is no acute cause to explain your lower abdominal pain.  Given how much laxatives you have been taking over the last several days, I suspect there is some degree of irritation going on.  If you are continue to have flareups of constipation, I would recommend using MiraLAX instead.  You can titrate this medication up or down until you have the desired effect -meaning that you can take several capsules at once without any risk of harm or irritation of your stomach lining or intestines.  I would also recommend following up with your PCP and scheduling a colonoscopy.  Typically, if you have had polyps in the past you were supposed to have follow-up every 3 to 5 years.

## 2021-11-06 NOTE — ED Provider Notes (Signed)
[facility] Provider Note   CSN: [id number] Arrival date & time: 11/06/21  1122     History  Chief Complaint  Patient presents with   Constipation    Lawrence Carlson is a 66 y.o. male with previous history of PE and DVT who presents to the emergency department for evaluation of constipation and left lower quadrant pain x1 week.  Patient states that he goes several times a day, usually immediately after eating and when he becomes constipated he has severe abdominal pain and belching.  He notes that when this happened earlier this week, he began taking Dulcolax with some improvement.  He went to his PCP who recommended MiraLAX, however on his way home from that appointment, he stopped by drugstore and from bottles of mag citrate.  He states that he took a bottle which reportedly "cleaned him out".  However, when he stopped taking it, he began feeling constipated again and has been taking mag citrate every day for the last 3 days.  He states that he has been having diarrhea secondary to this but believes that anytime that he stops he becomes constipated again.  He is also having left lower quadrant pain and frequent belching.  He notes that his PCP referred him to a gastroenterologist in order to have a colonoscopy, but he has not heard back from this office yet.  Patient states that he came here for testing.   Constipation      Home Medications Prior to Admission medications   Medication Sig Start Date End Date Taking? Authorizing Provider  ALPRAZolam Duanne Moron) 0.5 MG tablet Take 0.5 mg by mouth at bedtime. 08/31/20   [provider]  apixaban (ELIQUIS) 2.5 MG TABS tablet Take 1 tablet (2.5 mg total) by mouth 2 (two) times daily. 06/29/21 07/29/21  Swinteck, Aaron Edelman, MD  Cyanocobalamin (B-12 PO) Take 1 tablet by mouth daily.    [provider]  HYDROcodone-acetaminophen (NORCO) 5-325 MG tablet Take 1 tablet by mouth every 4 (four) hours as needed for  moderate pain. 06/29/21   Swinteck, Aaron Edelman, MD  methocarbamol (ROBAXIN) 500 MG tablet Take 1 tablet (500 mg total) by mouth every 6 (six) hours as needed for muscle spasms. 06/29/21   Swinteck, Aaron Edelman, MD  Multiple Vitamin (MULTIVITAMIN WITH MINERALS) TABS tablet Take 2 tablets by mouth daily.    [provider]  ondansetron (ZOFRAN) 4 MG tablet Take 1 tablet (4 mg total) by mouth every 8 (eight) hours as needed for nausea or vomiting. 06/29/21   Swinteck, Aaron Edelman, MD  traZODone (DESYREL) 100 MG tablet Take 100 mg by mouth at bedtime.    [provider]      Allergies    Patient has no known allergies.    Review of Systems   Review of Systems  Gastrointestinal:  Positive for constipation.    Physical Exam Updated Vital Signs BP (!) 119/92   Pulse 74   Temp 98.4 F (36.9 C) (Oral)   Resp 15   Ht 5\' 11"  (1.803 m)   Wt 82.1 kg   SpO2 100%   BMI 25.24 kg/m  Physical Exam Vitals and nursing note reviewed.  Constitutional:      General: He is not in acute distress.    Appearance: He is not ill-appearing.  HENT:     Head: Atraumatic.  Eyes:     Conjunctiva/sclera: Conjunctivae normal.  Cardiovascular:     Rate and Rhythm: Normal rate and regular rhythm.  Pulses: Normal pulses.     Heart sounds: No murmur heard. Pulmonary:     Effort: Pulmonary effort is normal. No respiratory distress.     Breath sounds: Normal breath sounds.  Abdominal:     General: Abdomen is flat. There is no distension.     Palpations: Abdomen is soft.     Tenderness: There is abdominal tenderness in the left lower quadrant. There is no right CVA tenderness, left CVA tenderness or guarding. Negative signs include Murphy's sign and McBurney's sign.  Musculoskeletal:        General: Normal range of motion.     Cervical back: Normal range of motion.  Skin:    General: Skin is warm and dry.     Capillary Refill: Capillary refill takes less than 2 seconds.  Neurological:     General: No focal  deficit present.     Mental Status: He is alert.  Psychiatric:        Mood and Affect: Mood normal.     ED Results / Procedures / Treatments   Labs (all labs ordered are listed, but only abnormal results are displayed) Labs Reviewed  COMPREHENSIVE METABOLIC PANEL - Abnormal; Notable for the following components:      Result Value   Glucose, Bld 110 (*)    All other components within normal limits  CBC WITH DIFFERENTIAL/PLATELET  LIPASE, BLOOD  URINALYSIS, ROUTINE W REFLEX MICROSCOPIC    EKG None  Radiology CT ABDOMEN PELVIS W CONTRAST  Result Date: 11/06/2021 CLINICAL DATA:  LEFT lower quadrant abdominal pain EXAM: CT ABDOMEN AND PELVIS WITH CONTRAST TECHNIQUE: Multidetector CT imaging of the abdomen and pelvis was performed using the standard protocol following bolus administration of intravenous contrast. RADIATION DOSE REDUCTION: This exam was performed according to the departmental dose-optimization program which includes automated exposure control, adjustment of the mA and/or kV according to patient size and/or use of iterative reconstruction technique. CONTRAST:  155mL OMNIPAQUE IOHEXOL 300 MG/ML  SOLN COMPARISON:  CT 03/12/2017 com PET-CT 05/04/2017 FINDINGS: Lower chest: Smoothly marginated 20 mm nodule at the LEFT lung base is unchanged from CT 03/12/2017. No metabolic activity on comparison PET-CT scan 05/04/2017. Hepatobiliary: No focal hepatic lesion. No biliary duct dilatation. Common bile duct is normal. Pancreas: Pancreas is normal. No ductal dilatation. No pancreatic inflammation. Spleen: Normal spleen Adrenals/urinary tract: Adrenal glands and kidneys are normal. The ureters and bladder normal. Stomach/Bowel: The stomach, duodenum, and small bowel normal. Post appendectomy. The colon and rectosigmoid colon are normal. Vascular/Lymphatic: Abdominal aorta is normal caliber. No periportal or retroperitoneal adenopathy. No pelvic adenopathy. Reproductive: Prostate unremarkable  Other: Small fat filled LEFT inguinal hernia. Benign calcification along the spermatic cord (image 85/2.). Musculoskeletal: Degenerative changes of the RIGHT hip. Degenerative osteophytosis of the spine. IMPRESSION: 1. No explanation for LEFT lower quadrant pain.  No diverticulosis. 2. Small fat filled LEFT inguinal hernia. 3. 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 4. Benign nodule at the LEFT lung base. Electronically Signed   By: Suzy Bouchard M.D.   On: 11/06/2021 15:03    Procedures Procedures    Medications Ordered in ED Medications  alum & mag hydroxide-simeth (MAALOX/MYLANTA) 200-200-20 MG/5ML suspension 30 mL (30 mLs Oral Given 11/06/21 1336)    And  lidocaine (XYLOCAINE) 2 % viscous mouth solution 15 mL (15 mLs Oral Given 11/06/21 1336)  iohexol (OMNIPAQUE) 300 MG/ML solution 100 mL (100 mLs Intravenous Contrast Given 11/06/21 1428)    ED Course/ Medical Decision Making/ A&P Clinical Course  as of 11/06/21 1530  Sun Nov 06, 2021  1510 CT ABDOMEN PELVIS W CONTRAST [EC]    Clinical Course User Index [EC] Tonye Pearson, PA-C                           Medical Decision Making Amount and/or Complexity of Data Reviewed Labs: ordered. Radiology: ordered. Decision-making details documented in ED Course.  Risk OTC drugs. Prescription drug management.   Social determinants of health:  Social History   Socioeconomic History   Marital status: Married    Spouse name: Not on file   Number of children: 2   Years of education: 14   Highest education level: Not on file  Occupational History   Occupation: Press photographer    Comment: self-employed  Tobacco Use   Smoking status: Never   Smokeless tobacco: Never  Vaping Use   Vaping Use: Never used  Substance and Sexual Activity   Alcohol use: Never   Drug use: No   Sexual activity: Not Currently    Comment: reports no sex in 57 years  Other Topics Concern   Not on file  Social History Narrative   Lives at home with  wife in [place]. Likes to play golf, run, and play golf.   Social Determinants of Health   Financial Resource Strain: Not on file  Food Insecurity: Not on file  Transportation Needs: Not on file  Physical Activity: Not on file  Stress: Not on file  Social Connections: Not on file  Intimate Partner Violence: Not on file     Initial impression:  This patient presents to the ED for concern of constipation and left lower quadrant pain and constipation, this involves an extensive number of treatment options, and is a complaint that carries with it a high risk of complications and morbidity.   Differentials include constipation, colitis, neoplasm, overuse of laxatives, diverticulitis.   Comorbidities affecting care:  Previous history of PE  Additional history obtained: PCP records  Lab Tests  I Ordered, reviewed, and interpreted labs and EKG.  The pertinent results include:  CMP, CBC, lipase and UA normal  Imaging Studies ordered:  I ordered imaging studies including  CT abdomen without acute findings of left lower quadrant pain I independently visualized and interpreted imaging and I agree with the radiologist interpretation.   Medicines ordered and prescription drug management:  I ordered medication including: GI cocktail Reevaluation of the patient after these medicines showed that the patient improved I have reviewed the patients home medicines and have made adjustments as needed   ED Course/Re-evaluation: 66 year old male presents to the emergency department for evaluation of possible constipation with left lower quadrant pain.  Vitals are without acute abnormality.  On exam, he is overall well-appearing in no acute distress and has mild left lower quadrant tenderness to palpation.  I suspect that his symptoms are secondary to overuse of laxatives which can cause some cramping and discomfort, however will obtain CT abdomen and labs out of abundance of caution.  Patient  was also given GI cocktail. On reevaluation, patient is feeling slightly improved with a GI cocktail.  His labs were overall normal and CT imaging was without any findings of acute causes of his left lower quadrant pain.  We discussed that overuse of laxative such as Dulcolax and mag citrate can cause discomfort of the intestines which may be what he is experiencing.  He does note that he had a colonoscopy approximately  12 years ago where he had polyps removed and has not had one since then.  Given that, I strongly recommend that he follow-up with his PCP and schedule a colonoscopy as soon as possible.  Advised patient to discontinue use of Dulcolax and mag citrate and instead use MiraLAX which she can titrate up and down to his desired effect.  Patient expresses understanding and is amenable to plan.  Disposition:  After consideration of the diagnostic results, physical exam, history and the patients response to treatment feel that the patent would benefit from discharge.   Left lower quadrant pain: Plan and management as described above. Discharged home in good condition.  Final Clinical Impression(s) / ED Diagnoses Final diagnoses:  LLQ pain    Rx / DC Orders ED Discharge Orders     None         Tonye Pearson, [place] 11/06/21 1538    Tegeler, Gwenyth Allegra, MD 11/06/21 1554

## 2021-11-06 NOTE — ED Triage Notes (Signed)
Pt arrives pov, steady gait, c/o constipation x 1 week that resolved with mag citrate. Pt left AMA from ED on 6/9. Pt requesting CT scan. Pt denies abdominal pain

## 2021-11-06 NOTE — ED Notes (Signed)
Pt ambulatory to CT

## 2021-11-06 NOTE — ED Notes (Signed)
Denies n/v.States that he has taken three bottle of magnesium citrate since Thursday.Reports pain on left side.

## 2021-11-08 ENCOUNTER — Other Ambulatory Visit: Payer: Self-pay

## 2021-11-08 ENCOUNTER — Emergency Department (HOSPITAL_BASED_OUTPATIENT_CLINIC_OR_DEPARTMENT_OTHER)
Admission: EM | Admit: 2021-11-08 | Discharge: 2021-11-08 | Disposition: A | Discharge status: Home / Self Care | Payer: Medicare HMO | Attending: Emergency Medicine | Admitting: Emergency Medicine

## 2021-11-08 ENCOUNTER — Encounter (HOSPITAL_BASED_OUTPATIENT_CLINIC_OR_DEPARTMENT_OTHER): Payer: Self-pay | Admitting: Emergency Medicine

## 2021-11-08 DIAGNOSIS — F1990 Other psychoactive substance use, unspecified, uncomplicated: Secondary | ICD-10-CM

## 2021-11-08 DIAGNOSIS — R197 Diarrhea, unspecified: Secondary | ICD-10-CM | POA: Insufficient documentation

## 2021-11-08 DIAGNOSIS — R252 Cramp and spasm: Secondary | ICD-10-CM | POA: Insufficient documentation

## 2021-11-08 LAB — CBC WITH DIFFERENTIAL/PLATELET
Abs Immature Granulocytes: 0.02 10*3/uL (ref 0.00–0.07)
Basophils Absolute: 0 10*3/uL (ref 0.0–0.1)
Basophils Relative: 0 %
Eosinophils Absolute: 0.1 10*3/uL (ref 0.0–0.5)
Eosinophils Relative: 1 %
HCT: 44.9 % (ref 39.0–52.0)
Hemoglobin: 15.6 g/dL (ref 13.0–17.0)
Immature Granulocytes: 0 %
Lymphocytes Relative: 22 %
Lymphs Abs: 1.2 10*3/uL (ref 0.7–4.0)
MCH: 30.8 pg (ref 26.0–34.0)
MCHC: 34.7 g/dL (ref 30.0–36.0)
MCV: 88.7 fL (ref 80.0–100.0)
Monocytes Absolute: 0.6 10*3/uL (ref 0.1–1.0)
Monocytes Relative: 10 %
Neutro Abs: 3.7 10*3/uL (ref 1.7–7.7)
Neutrophils Relative %: 67 %
Platelets: 193 10*3/uL (ref 150–400)
RBC: 5.06 MIL/uL (ref 4.22–5.81)
RDW: 13.6 % (ref 11.5–15.5)
WBC: 5.6 10*3/uL (ref 4.0–10.5)
nRBC: 0 % (ref 0.0–0.2)

## 2021-11-08 LAB — COMPREHENSIVE METABOLIC PANEL
ALT: 23 U/L (ref 0–44)
AST: 26 U/L (ref 15–41)
Albumin: 4 g/dL (ref 3.5–5.0)
Alkaline Phosphatase: 67 U/L (ref 38–126)
Anion gap: 7 (ref 5–15)
BUN: 16 mg/dL (ref 8–23)
CO2: 28 mmol/L (ref 22–32)
Calcium: 9.2 mg/dL (ref 8.9–10.3)
Chloride: 106 mmol/L (ref 98–111)
Creatinine, Ser: 1.03 mg/dL (ref 0.61–1.24)
GFR, Estimated: >60 mL/min (ref >60)
Glucose, Bld: 119 mg/dL — ABNORMAL HIGH (ref 70–99)
Potassium: 3.6 mmol/L (ref 3.5–5.1)
Sodium: 141 mmol/L (ref 135–145)
Total Bilirubin: 0.6 mg/dL (ref 0.3–1.2)
Total Protein: 7.1 g/dL (ref 6.5–8.1)

## 2021-11-08 LAB — TROPONIN I (HIGH SENSITIVITY): Troponin I (High Sensitivity): 4 ng/L (ref <18)

## 2021-11-08 LAB — URINALYSIS, ROUTINE W REFLEX MICROSCOPIC
Bilirubin Urine: NEGATIVE
Glucose, UA: NEGATIVE mg/dL
Hgb urine dipstick: NEGATIVE
Ketones, ur: NEGATIVE mg/dL
Leukocytes,Ua: NEGATIVE
Nitrite: NEGATIVE
Protein, ur: NEGATIVE mg/dL
Specific Gravity, Urine: 1.02 (ref 1.005–1.030)
pH: 7 (ref 5.0–8.0)

## 2021-11-08 LAB — MAGNESIUM: Magnesium: 2.3 mg/dL (ref 1.7–2.4)

## 2021-11-08 MED ORDER — LACTATED RINGERS IV BOLUS
1000.0000 mL | Freq: Once | INTRAVENOUS | Status: AC
Start: 2021-11-08 — End: 2021-11-08
  Administered 2021-11-08: 1000 mL via INTRAVENOUS

## 2021-11-08 NOTE — ED Triage Notes (Signed)
Pt reports taking magnesium citrate at 9pm with good results. Pt now having muscle spasms and c/o pain all over.

## 2021-11-08 NOTE — Discharge Instructions (Signed)
Do not take anymore magnesium citrate or laxatives.  Drink plenty of fluids to stay hydrated.

## 2021-11-08 NOTE — ED Provider Notes (Signed)
[facility] Provider Note   CSN: [id number] Arrival date & time: 11/08/21  0124     History  Chief Complaint  Patient presents with   Spasms    Lawrence Carlson is a 66 y.o. male.  The history is provided by the patient, the spouse and medical records.  Lawrence Carlson is a 66 y.o. male who presents to the Emergency Department complaining of cramping.  He presents to the emergency department accompanied by his wife for evaluation of cramping and spasms throughout his entire body.  He states that about a week ago he has been experiencing alternating constipation and diarrhea.  He has been taking MiraLAX, mag citrate for his symptoms.  He has taken doses of mag citrate on Thursday, Friday, Saturday.  He did not take any on Sunday.  Today he did have a loose stool this morning and then felt like he needed to have a bowel movement so he took another dose of magnesium citrate.  Then his spasms throughout his body worsen.  He has associated belching.  He feels nervous and dehydrated and has a lump sensation in his throat.  He did have some fleeting central chest pain prior to ED arrival.  He also reports an odor to his urine that started today.  He is urinating well but feels dehydrated.  Since taking the magnesium citrate this evening he has had diarrhea this evening with 4 episodes of loose stools.  He does have a remote history of DVT but is not currently on anticoagulation.  No fever, difficulty breathing, nausea, vomiting, dysuria.      Home Medications Prior to Admission medications   Medication Sig Start Date End Date Taking? Authorizing Provider  ALPRAZolam Duanne Moron) 0.5 MG tablet Take 0.5 mg by mouth at bedtime. 08/31/20   [provider]  apixaban (ELIQUIS) 2.5 MG TABS tablet Take 1 tablet (2.5 mg total) by mouth 2 (two) times daily. 06/29/21 07/29/21  Swinteck, Aaron Edelman, MD  Cyanocobalamin (B-12 PO) Take 1 tablet by mouth daily.    [provider]   HYDROcodone-acetaminophen (NORCO) 5-325 MG tablet Take 1 tablet by mouth every 4 (four) hours as needed for moderate pain. 06/29/21   Swinteck, Aaron Edelman, MD  methocarbamol (ROBAXIN) 500 MG tablet Take 1 tablet (500 mg total) by mouth every 6 (six) hours as needed for muscle spasms. 06/29/21   Swinteck, Aaron Edelman, MD  Multiple Vitamin (MULTIVITAMIN WITH MINERALS) TABS tablet Take 2 tablets by mouth daily.    [provider]  ondansetron (ZOFRAN) 4 MG tablet Take 1 tablet (4 mg total) by mouth every 8 (eight) hours as needed for nausea or vomiting. 06/29/21   Swinteck, Aaron Edelman, MD  traZODone (DESYREL) 100 MG tablet Take 100 mg by mouth at bedtime.    [provider]      Allergies    Patient has no known allergies.    Review of Systems   Review of Systems  All other systems reviewed and are negative.   Physical Exam Updated Vital Signs BP (!) 147/97   Pulse 77   Temp 98.1 F (36.7 C) (Oral)   Resp 12   Ht 5\' 11"  (1.803 m)   Wt 82.1 kg   SpO2 99%   BMI 25.24 kg/m  Physical Exam Vitals and nursing note reviewed.  Constitutional:      Appearance: He is well-developed.  HENT:     Head: Normocephalic and atraumatic.  Cardiovascular:     Rate and Rhythm: Normal rate  and regular rhythm.  Pulmonary:     Effort: Pulmonary effort is normal. No respiratory distress.  Abdominal:     Palpations: Abdomen is soft.     Tenderness: There is no abdominal tenderness. There is no guarding or rebound.  Musculoskeletal:        General: No swelling or tenderness.  Skin:    General: Skin is warm and dry.  Neurological:     Mental Status: He is alert and oriented to person, place, and time.  Psychiatric:        Behavior: Behavior normal.     ED Results / Procedures / Treatments   Labs (all labs ordered are listed, but only abnormal results are displayed) Labs Reviewed  COMPREHENSIVE METABOLIC PANEL - Abnormal; Notable for the following components:      Result Value   Glucose, Bld  119 (*)    All other components within normal limits  CBC WITH DIFFERENTIAL/PLATELET  MAGNESIUM  URINALYSIS, ROUTINE W REFLEX MICROSCOPIC  TROPONIN I (HIGH SENSITIVITY)  TROPONIN I (HIGH SENSITIVITY)    EKG EKG Interpretation  Date/Time:  Tuesday November 08 2021 01:33:10 EDT Ventricular Rate:  73 PR Interval:  164 QRS Duration: 89 QT Interval:  396 QTC Calculation: 437 R Axis:   43 Text Interpretation: Sinus rhythm Consider left atrial enlargement Low voltage, extremity leads Confirmed by Quintella Reichert [phone] on 11/08/2021 1:45:01 AM  Radiology CT ABDOMEN PELVIS W CONTRAST  Result Date: 11/06/2021 CLINICAL DATA:  LEFT lower quadrant abdominal pain EXAM: CT ABDOMEN AND PELVIS WITH CONTRAST TECHNIQUE: Multidetector CT imaging of the abdomen and pelvis was performed using the standard protocol following bolus administration of intravenous contrast. RADIATION DOSE REDUCTION: This exam was performed according to the departmental dose-optimization program which includes automated exposure control, adjustment of the mA and/or kV according to patient size and/or use of iterative reconstruction technique. CONTRAST:  115mL OMNIPAQUE IOHEXOL 300 MG/ML  SOLN COMPARISON:  CT 03/12/2017 com PET-CT 05/04/2017 FINDINGS: Lower chest: Smoothly marginated 20 mm nodule at the LEFT lung base is unchanged from CT 03/12/2017. No metabolic activity on comparison PET-CT scan 05/04/2017. Hepatobiliary: No focal hepatic lesion. No biliary duct dilatation. Common bile duct is normal. Pancreas: Pancreas is normal. No ductal dilatation. No pancreatic inflammation. Spleen: Normal spleen Adrenals/urinary tract: Adrenal glands and kidneys are normal. The ureters and bladder normal. Stomach/Bowel: The stomach, duodenum, and small bowel normal. Post appendectomy. The colon and rectosigmoid colon are normal. Vascular/Lymphatic: Abdominal aorta is normal caliber. No periportal or retroperitoneal adenopathy. No pelvic adenopathy.  Reproductive: Prostate unremarkable Other: Small fat filled LEFT inguinal hernia. Benign calcification along the spermatic cord (image 85/2.). Musculoskeletal: Degenerative changes of the RIGHT hip. Degenerative osteophytosis of the spine. IMPRESSION: 1. No explanation for LEFT lower quadrant pain.  No diverticulosis. 2. Small fat filled LEFT inguinal hernia. 3. 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 4. Benign nodule at the LEFT lung base. Electronically Signed   By: Suzy Bouchard M.D.   On: 11/06/2021 15:03    Procedures Procedures    Medications Ordered in ED Medications  lactated ringers bolus 1,000 mL ( Intravenous Stopped 11/08/21 0241)    ED Course/ Medical Decision Making/ A&P                           Medical Decision Making Amount and/or Complexity of Data Reviewed Labs: ordered.   Patient here for evaluation of muscle cramping and spasms, has diarrhea today after taking  multiple doses of magnesium citrate.  He does have occasional twitching/tremors on initial evaluation, moves all extremities symmetrically.  No significant abdominal tenderness on examination.  He was treated with IV fluid hydration with lactated Ringer's.  Labs without significant electrolyte abnormality.  UA not consistent with UTI.  Reviewed recent ED visit.  After treatment with IV fluids patient is feeling improved.  Counseled patient on importance of avoiding magnesium citrate as this can cause impressive diarrhea that can result in dehydration.  Discussed importance of oral fluid hydration as well as return precautions.        Final Clinical Impression(s) / ED Diagnoses Final diagnoses:  Muscle cramping  Diarrhea, unspecified type  Misuse of medication    Rx / DC Orders ED Discharge Orders     None         Quintella Reichert, MD 11/08/21 [phone]

## 2021-11-24 ENCOUNTER — Other Ambulatory Visit (HOSPITAL_BASED_OUTPATIENT_CLINIC_OR_DEPARTMENT_OTHER): Payer: Self-pay | Admitting: Emergency Medicine

## 2021-11-24 ENCOUNTER — Ambulatory Visit (HOSPITAL_COMMUNITY)
Admission: AD | Admit: 2021-11-24 | Discharge: 2021-11-24 | Disposition: A | Discharge status: Home / Self Care | Payer: Medicare HMO | Attending: Psychiatry | Admitting: Psychiatry

## 2021-11-24 DIAGNOSIS — R4182 Altered mental status, unspecified: Secondary | ICD-10-CM

## 2021-11-24 NOTE — H&P (Signed)
Behavioral Health Medical Screening Exam  Lawrence Carlson is a 66 y.o. male. With a history of TBI  and mood disorder.  The patient reports increased anxiety," it is hard to make decisions I do not know why but I have not been following up with my regular appointments with my psychiatrist.  Per the patient he is taking Xanax, trazodone, doxepin, Lexapro, and Ambien and over-the-counter ZzzQuil.  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  Patient's son accompanies patient in the interview room.  The son stated that his father is worried about retirement at work and at this point he is worried that he may not have to pay his bills.  According to the patient's son's father's fixated on his finances.  According to the patient's son's father keep going to a bunch of different doctors to get different kind of medicine, he has a lot of medicine at home that I do not even know if he is taking.   The combination of retirement and going from one doctor to the other doctor for his apartment and patient's condition.  According to patient he has not seen a psychiatrist in over a year, and he has been going to primary care provider in [place].  Patient stated that some of his providers discharged him because they do not want to be with him.  According to patient in the past for increase in his Xanax and they told him no.   Face-to-face observation of patient, he is alert and oriented x4 speech is clear, mood is anxious, affect congruent with mood.  Patient denies SI, HI, AVH, or paranoia.  Patient denies drinking or smoking.  NP discussed with patient and his son to follow-up with [facility] walk in clinic.  Resource information given to the patient and his. Recommend discharge with resources to follow-up with [facility] walk-in psychiatry.  Total Time spent with patient: 20 minutes  Psychiatric Specialty Exam:  Presentation  General Appearance: No data  recorded Eye Contact:No data recorded Speech:No data recorded Speech Volume:No data recorded Handedness:No data recorded  Mood and Affect  Mood:No data recorded Affect:No data recorded  Thought Process  Thought Processes:No data recorded Descriptions of Associations:No data recorded Orientation:No data recorded Thought Content:No data recorded History of Schizophrenia/Schizoaffective disorder:No data recorded Duration of Psychotic Symptoms:No data recorded Hallucinations:No data recorded Ideas of Reference:No data recorded Suicidal Thoughts:No data recorded Homicidal Thoughts:No data recorded  Sensorium  Memory:No data recorded Judgment:No data recorded Insight:No data recorded  Executive Functions  Concentration:No data recorded Attention Span:No data recorded Recall:No data recorded Fund of Knowledge:No data recorded Language:No data recorded  Psychomotor Activity  Psychomotor Activity:No data recorded  Assets  Assets:No data recorded  Sleep  Sleep:No data recorded   Physical Exam: Physical Exam HENT:     Head: Normocephalic.     Nose: Nose normal.  Cardiovascular:     Rate and Rhythm: Normal rate.  Pulmonary:     Effort: Pulmonary effort is normal.  Musculoskeletal:        General: Normal range of motion.     Cervical back: Normal range of motion.  Skin:    General: Skin is warm.  Neurological:     General: No focal deficit present.     Mental Status: He is alert.  Psychiatric:        Mood and Affect: Mood normal.        Behavior: Behavior normal.  Thought Content: Thought content normal.        Judgment: Judgment normal.    Review of Systems  Constitutional: Negative.   HENT: Negative.    Eyes: Negative.   Respiratory: Negative.    Cardiovascular: Negative.   Gastrointestinal: Negative.   Genitourinary: Negative.   Musculoskeletal: Negative.   Skin: Negative.   Neurological: Negative.   Endo/Heme/Allergies: Negative.    Psychiatric/Behavioral:  Positive for depression. The patient is nervous/anxious.    Blood pressure (!) 144/103, pulse 93, temperature 98.5 F (36.9 C), temperature source Oral, resp. rate 20, SpO2 100 %. There is no height or weight on file to calculate BMI.  Musculoskeletal: Strength & Muscle Tone: within normal limits Gait & Station: normal Patient leans: N/A   Recommendations:  Based on my evaluation the patient does not appear to have an emergency medical condition.  Sindy Guadeloupe, NP 11/24/2021, 1:48 AM

## 2021-11-28 ENCOUNTER — Telehealth (HOSPITAL_BASED_OUTPATIENT_CLINIC_OR_DEPARTMENT_OTHER): Payer: Self-pay

## 2021-12-01 ENCOUNTER — Ambulatory Visit: Payer: Medicare HMO | Admitting: Behavioral Health

## 2021-12-01 ENCOUNTER — Telehealth (HOSPITAL_BASED_OUTPATIENT_CLINIC_OR_DEPARTMENT_OTHER): Payer: Self-pay

## 2021-12-20 ENCOUNTER — Encounter: Payer: Self-pay | Admitting: Behavioral Health

## 2021-12-20 ENCOUNTER — Ambulatory Visit: Payer: Medicare HMO | Admitting: Behavioral Health

## 2021-12-20 DIAGNOSIS — F331 Major depressive disorder, recurrent, moderate: Secondary | ICD-10-CM | POA: Diagnosis not present

## 2021-12-20 DIAGNOSIS — F411 Generalized anxiety disorder: Secondary | ICD-10-CM

## 2021-12-20 DIAGNOSIS — F309 Manic episode, unspecified: Secondary | ICD-10-CM | POA: Diagnosis not present

## 2021-12-20 DIAGNOSIS — F3130 Bipolar disorder, current episode depressed, mild or moderate severity, unspecified: Secondary | ICD-10-CM

## 2021-12-20 DIAGNOSIS — F319 Bipolar disorder, unspecified: Secondary | ICD-10-CM

## 2021-12-20 DIAGNOSIS — F0630 Mood disorder due to known physiological condition, unspecified: Secondary | ICD-10-CM

## 2021-12-20 MED ORDER — OLANZAPINE 10 MG PO TABS: ORAL_TABLET | ORAL | 1 refills | Status: DC

## 2021-12-20 NOTE — Progress Notes (Signed)
[facility]  Patient ID: Lawrence Carlson,  MRN: [id number]  PCP: [facility]  Date of Evaluation: 12/20/2021 Time spent:45 minutes  Chief Complaint:  Chief Complaint   Anxiety; Depression; Medication Problem; Follow-up; Family Problem; Patient Education; Manic Behavior     HISTORY/CURRENT STATUS: HPI  Lawrence Carlson presents for follow-up and medication management. He is more calm and collected this visit vs usual extreme mania and non compliance. He says that he has a lot of anxiety and feels depressed. He says that he has not been sleeping and does not feel good. He says, "I apologize for how I handled my care. I now realize how stupid I was and should have taken my medications like I should have".  Says that everything caught up with him. 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 Says I should have listened to everyone. He says, "I need to go back on some medication that will help me. He endorses some mild mania, mixed with depression. He says he understands that he had some delusional thinking previously. No  delusions of grandeur this visit.  No auditory or visual hallucinations. No SI/ or HI.    Past Psychiatric medication trials: Lexapro 20 mg increases severe manic state Depakote: Stopped non-compliant Zyprexa: non-compliant, not sure medication taken Zolpidem Xanax        Individual Medical History/ Review of Systems: Changes? :No   Allergies: Patient has no known allergies.  Current Medications:  Current Outpatient Medications:    OLANZapine (ZYPREXA) 10 MG tablet, Take one tablet 10 mg for 7 days, then take 1.5 tablet 15 mg total daily., Disp: 45 tablet, Rfl: 1   ALPRAZolam (XANAX) 0.5 MG tablet, Take 0.5 mg by mouth at bedtime., Disp: , Rfl:    apixaban (ELIQUIS) 2.5 MG TABS tablet, Take 1 tablet (2.5 mg total) by mouth 2 (two) times daily., Disp: 60 tablet, Rfl: 0    Cyanocobalamin (B-12 PO), Take 1 tablet by mouth daily., Disp: , Rfl:    HYDROcodone-acetaminophen (NORCO) 5-325 MG tablet, Take 1 tablet by mouth every 4 (four) hours as needed for moderate pain., Disp: 40 tablet, Rfl: 0   methocarbamol (ROBAXIN) 500 MG tablet, Take 1 tablet (500 mg total) by mouth every 6 (six) hours as needed for muscle spasms., Disp: 20 tablet, Rfl: 0   Multiple Vitamin (MULTIVITAMIN WITH MINERALS) TABS tablet, Take 2 tablets by mouth daily., Disp: , Rfl:    ondansetron (ZOFRAN) 4 MG tablet, Take 1 tablet (4 mg total) by mouth every 8 (eight) hours as needed for nausea or vomiting., Disp: 20 tablet, Rfl: 0   traZODone (DESYREL) 100 MG tablet, Take 100 mg by mouth at bedtime., Disp: , Rfl:  Medication Side Effects: none  Family Medical/ Social History: Changes? No  MENTAL HEALTH EXAM:  There were no vitals taken for this visit.There is no height or weight on file to calculate BMI.  General Appearance: Casual, Neat, and Well Groomed  Eye Contact:  Good  Speech:  Clear and Coherent  Volume:  Normal  Mood:  Anxious and Depressed  Affect:  Appropriate  Thought Process:  Coherent  Orientation:  Full (Time, Place, and Person)  Thought Content: Logical   Suicidal Thoughts:  No  Homicidal Thoughts:  No  Memory:  WNL  Judgement:  Poor  Insight:  Good and Lacking  Psychomotor Activity:  Normal  Concentration:  Concentration: Good  Recall:  Fair  Fund of Knowledge: Poor  Language: Fair  Assets:  Desire for Improvement Physical Health Resilience  ADL's:  Intact  Cognition: WNL  Prognosis:  Good    DIAGNOSES:    ICD-10-CM   1. 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 Mania (HCC)  F30.9     3. Anxiety state  F41.1     4. Moderate episode of recurrent major depressive disorder (HCC)  F33.1     5. Bipolar I disorder (HCC)  F31.9 OLANZapine (ZYPREXA) 10 MG tablet    6. Bipolar I disorder, most recent  episode depressed (HCC)  F31.30 OLANZapine (ZYPREXA) 10 MG tablet      Receiving Psychotherapy: No    RECOMMENDATIONS:    Recommendations/Plan   Patient Stopped Depakote, non-compliance in Oct. Last year.  Greater than 50% of  40 min interview time with patient was spent on counseling and coordination of care. We discussed his extensive hx of Bipolar disorder with episodes of severe long lasting mania. We discussed his hx of non-compliance of medications and his previous reluctance to continue care. He agrees this visit to try Olanzapine and commits to medication compliance. He agrees to follow up in 4 weeks to reassess.  Will report worsening symptoms or side effects promptly. To follow up in 6 month to reevaluate per patient. Recommended psychotherapy/ DBT group therapy. Provided emergency contact numbers.      Reviewed PDMP   Joan Flores, NP

## 2021-12-21 ENCOUNTER — Telehealth: Payer: Self-pay | Admitting: Behavioral Health

## 2021-12-21 NOTE — Telephone Encounter (Signed)
Patient called to ask if it was okay to continue alprazolam. He said he was having a panic attack yesterday and didn't have a clear head. He said he told you he wasn't taking any medication. He said today his head was clearer and he wanted to be sure you were aware that he is taking alprazolam prn. It is on his med list, though is not mentioned in your note.

## 2021-12-21 NOTE — Telephone Encounter (Signed)
Yes, he can continue the alprazolam but due not mix with any Lorazepam. He should start his Olanzapine as prescribed.

## 2021-12-21 NOTE — Telephone Encounter (Signed)
Next visit is 01/17/22. Bennett would like to know if he should continue taking the Zanax with the Lorazepam. He forgot to ask on this recent appt. Phone number is [phone].

## 2021-12-21 NOTE — Telephone Encounter (Signed)
Patient notified

## 2022-01-09 ENCOUNTER — Other Ambulatory Visit: Payer: Self-pay | Admitting: Behavioral Health

## 2022-01-09 ENCOUNTER — Telehealth: Payer: Self-pay | Admitting: Behavioral Health

## 2022-01-09 MED ORDER — TRAZODONE HCL 100 MG PO TABS
150.0000 mg | ORAL_TABLET | Freq: Every day | ORAL | 1 refills | Status: DC
Start: 2022-01-09 — End: 2022-03-20

## 2022-01-09 NOTE — Telephone Encounter (Signed)
Pt stated he could not sleep before starting med and he still can't sleep now.He is also taking trazodone and xanax but states he has not slept in more then a week

## 2022-01-09 NOTE — Telephone Encounter (Signed)
Pt called reporting  Olanzapine not working. He can't sleep. Starting taking 7/25. APT [address]22 [facility] [place] [facility]. Contact Pt at [phone]

## 2022-01-09 NOTE — Telephone Encounter (Signed)
Please have him increase his Trazodone to 1.5 tablets 150 mg total and see if this helps.

## 2022-01-09 NOTE — Telephone Encounter (Signed)
Pt informed

## 2022-01-17 ENCOUNTER — Ambulatory Visit: Payer: Medicare HMO | Admitting: Behavioral Health

## 2022-01-17 NOTE — Progress Notes (Signed)
Patient did not provide 24 hour notice as required for cancellation but will waive charge this time.

## 2022-02-09 ENCOUNTER — Encounter (HOSPITAL_BASED_OUTPATIENT_CLINIC_OR_DEPARTMENT_OTHER): Payer: Self-pay

## 2022-02-09 ENCOUNTER — Emergency Department (HOSPITAL_BASED_OUTPATIENT_CLINIC_OR_DEPARTMENT_OTHER)
Admission: EM | Admit: 2022-02-09 | Discharge: 2022-02-09 | Disposition: A | Discharge status: Home / Self Care | Payer: Medicare HMO | Attending: Emergency Medicine | Admitting: Emergency Medicine

## 2022-02-09 ENCOUNTER — Other Ambulatory Visit: Payer: Self-pay

## 2022-02-09 DIAGNOSIS — R410 Disorientation, unspecified: Secondary | ICD-10-CM | POA: Diagnosis present

## 2022-02-09 DIAGNOSIS — Z5321 Procedure and treatment not carried out due to patient leaving prior to being seen by health care provider: Secondary | ICD-10-CM | POA: Insufficient documentation

## 2022-02-09 HISTORY — DX: Transient cerebral ischemic attack, unspecified: G45.9

## 2022-02-09 HISTORY — DX: Embolism and thrombosis of unspecified artery: G45.9

## 2022-02-09 LAB — URINALYSIS, MICROSCOPIC (REFLEX)

## 2022-02-09 LAB — CBC
HCT: 43.9 % (ref 39.0–52.0)
Hemoglobin: 15.4 g/dL (ref 13.0–17.0)
MCH: 32.6 pg (ref 26.0–34.0)
MCHC: 35.1 g/dL (ref 30.0–36.0)
MCV: 92.8 fL (ref 80.0–100.0)
Platelets: 185 10*3/uL (ref 150–400)
RBC: 4.73 MIL/uL (ref 4.22–5.81)
RDW: 12.4 % (ref 11.5–15.5)
WBC: 7.3 10*3/uL (ref 4.0–10.5)
nRBC: 0 % (ref 0.0–0.2)

## 2022-02-09 LAB — URINALYSIS, ROUTINE W REFLEX MICROSCOPIC
Bilirubin Urine: NEGATIVE
Glucose, UA: NEGATIVE mg/dL
Hgb urine dipstick: NEGATIVE
Ketones, ur: NEGATIVE mg/dL
Nitrite: NEGATIVE
Protein, ur: NEGATIVE mg/dL
Specific Gravity, Urine: 1.015 (ref 1.005–1.030)
pH: 7 (ref 5.0–8.0)

## 2022-02-09 LAB — BASIC METABOLIC PANEL
Anion gap: 9 (ref 5–15)
BUN: 18 mg/dL (ref 8–23)
CO2: 25 mmol/L (ref 22–32)
Calcium: 9.2 mg/dL (ref 8.9–10.3)
Chloride: 104 mmol/L (ref 98–111)
Creatinine, Ser: 1.04 mg/dL (ref 0.61–1.24)
GFR, Estimated: >60 mL/min (ref >60)
Glucose, Bld: 108 mg/dL — ABNORMAL HIGH (ref 70–99)
Potassium: 3.8 mmol/L (ref 3.5–5.1)
Sodium: 138 mmol/L (ref 135–145)

## 2022-02-09 NOTE — ED Triage Notes (Signed)
Patient states he feels like he is disoriented. Per patient and wife symptoms have been going on x several days. Patient took xanex at 65.  Family states he is slower to respond. No neuro deficits noted in triage  LKWT: June 1st.

## 2022-02-14 ENCOUNTER — Ambulatory Visit: Payer: Medicare HMO | Admitting: Behavioral Health

## 2022-02-19 ENCOUNTER — Other Ambulatory Visit: Payer: Self-pay | Admitting: Behavioral Health

## 2022-02-19 DIAGNOSIS — F3130 Bipolar disorder, current episode depressed, mild or moderate severity, unspecified: Secondary | ICD-10-CM

## 2022-02-19 DIAGNOSIS — F0630 Mood disorder due to known physiological condition, unspecified: Secondary | ICD-10-CM

## 2022-02-19 DIAGNOSIS — F319 Bipolar disorder, unspecified: Secondary | ICD-10-CM

## 2022-02-20 NOTE — Telephone Encounter (Signed)
Please schedule appt

## 2022-02-21 NOTE — Telephone Encounter (Signed)
Lvm for pt to call back and schedule.  

## 2022-02-22 ENCOUNTER — Telehealth: Payer: Self-pay | Admitting: Behavioral Health

## 2022-02-22 ENCOUNTER — Other Ambulatory Visit: Payer: Self-pay

## 2022-02-22 DIAGNOSIS — F319 Bipolar disorder, unspecified: Secondary | ICD-10-CM

## 2022-02-22 DIAGNOSIS — F3130 Bipolar disorder, current episode depressed, mild or moderate severity, unspecified: Secondary | ICD-10-CM

## 2022-02-22 DIAGNOSIS — F0630 Mood disorder due to known physiological condition, unspecified: Secondary | ICD-10-CM

## 2022-02-22 MED ORDER — OLANZAPINE 10 MG PO TABS: ORAL_TABLET | ORAL | 0 refills | Status: DC

## 2022-02-22 NOTE — Telephone Encounter (Signed)
Rx sent 

## 2022-02-22 NOTE — Telephone Encounter (Signed)
Next visit is 03/08/22. Requesting refill on Olanzapine called to:  [facility] [address] - [place], [place] - [address] [place]  Phone:  [phone]  Fax:  [phone]

## 2022-03-08 ENCOUNTER — Ambulatory Visit: Payer: Medicare HMO | Admitting: Behavioral Health

## 2022-03-14 ENCOUNTER — Encounter (HOSPITAL_BASED_OUTPATIENT_CLINIC_OR_DEPARTMENT_OTHER): Payer: Self-pay | Admitting: Emergency Medicine

## 2022-03-14 ENCOUNTER — Other Ambulatory Visit: Payer: Self-pay

## 2022-03-14 ENCOUNTER — Emergency Department (HOSPITAL_BASED_OUTPATIENT_CLINIC_OR_DEPARTMENT_OTHER)
Admission: EM | Admit: 2022-03-14 | Discharge: 2022-03-14 | Disposition: A | Discharge status: Home / Self Care | Payer: Medicare HMO | Attending: Emergency Medicine | Admitting: Emergency Medicine

## 2022-03-14 ENCOUNTER — Emergency Department (HOSPITAL_BASED_OUTPATIENT_CLINIC_OR_DEPARTMENT_OTHER): Payer: Medicare HMO

## 2022-03-14 DIAGNOSIS — R072 Precordial pain: Secondary | ICD-10-CM | POA: Diagnosis present

## 2022-03-14 DIAGNOSIS — F419 Anxiety disorder, unspecified: Secondary | ICD-10-CM

## 2022-03-14 DIAGNOSIS — Z7901 Long term (current) use of anticoagulants: Secondary | ICD-10-CM | POA: Insufficient documentation

## 2022-03-14 DIAGNOSIS — Z86711 Personal history of pulmonary embolism: Secondary | ICD-10-CM | POA: Diagnosis not present

## 2022-03-14 DIAGNOSIS — R0602 Shortness of breath: Secondary | ICD-10-CM | POA: Insufficient documentation

## 2022-03-14 DIAGNOSIS — Z79899 Other long term (current) drug therapy: Secondary | ICD-10-CM | POA: Diagnosis not present

## 2022-03-14 LAB — CBC
HCT: 46.1 % (ref 39.0–52.0)
Hemoglobin: 15.9 g/dL (ref 13.0–17.0)
MCH: 31.9 pg (ref 26.0–34.0)
MCHC: 34.5 g/dL (ref 30.0–36.0)
MCV: 92.6 fL (ref 80.0–100.0)
Platelets: 175 10*3/uL (ref 150–400)
RBC: 4.98 MIL/uL (ref 4.22–5.81)
RDW: 11.9 % (ref 11.5–15.5)
WBC: 5.6 10*3/uL (ref 4.0–10.5)
nRBC: 0 % (ref 0.0–0.2)

## 2022-03-14 LAB — BASIC METABOLIC PANEL
Anion gap: 6 (ref 5–15)
BUN: 18 mg/dL (ref 8–23)
CO2: 28 mmol/L (ref 22–32)
Calcium: 9.3 mg/dL (ref 8.9–10.3)
Chloride: 107 mmol/L (ref 98–111)
Creatinine, Ser: 0.81 mg/dL (ref 0.61–1.24)
GFR, Estimated: >60 mL/min (ref >60)
Glucose, Bld: 98 mg/dL (ref 70–99)
Potassium: 4.1 mmol/L (ref 3.5–5.1)
Sodium: 141 mmol/L (ref 135–145)

## 2022-03-14 LAB — TROPONIN I (HIGH SENSITIVITY)
Troponin I (High Sensitivity): 5 ng/L (ref <18)
Troponin I (High Sensitivity): 5 ng/L (ref <18)

## 2022-03-14 MED ORDER — ASPIRIN 81 MG PO CHEW
324.0000 mg | CHEWABLE_TABLET | Freq: Once | ORAL | Status: AC
  Administered 2022-03-14: 324 mg via ORAL
  Filled 2022-03-14: qty 4

## 2022-03-14 MED ORDER — LORAZEPAM 1 MG PO TABS: 1.0000 mg | ORAL_TABLET | Freq: Three times a day (TID) | ORAL | 0 refills | Status: DC | PRN

## 2022-03-14 MED ORDER — LORAZEPAM 2 MG/ML IJ SOLN
1.0000 mg | Freq: Once | INTRAMUSCULAR | Status: AC
  Administered 2022-03-14: 1 mg via INTRAVENOUS
  Filled 2022-03-14: qty 1

## 2022-03-14 NOTE — ED Notes (Signed)
Present with left ant chest pain, describes as pressure, states is an 8 on 0-10 scale, having light headiness and nausea at this time as well, states was sudden onset of chest pressure today approx at lunch time today. , states he was riding in a car when onset of pressure was present.

## 2022-03-14 NOTE — Discharge Instructions (Signed)
Make an appointment follow-up with cardiology.  Referral information provided above.  Also follow back up with your primary care doctor to get your Xanax renewed.  We will give a short course of Ativan to help in the meantime.

## 2022-03-14 NOTE — ED Triage Notes (Signed)
Recurrent Left chest tightness since this morning , shocking feeling and difficulty to breath he said , speaks full sentences , no obvious distress . Nausea and lightheaded .

## 2022-03-14 NOTE — ED Notes (Signed)
Patient feeling light headed, returns from radiology, placed in room 4.

## 2022-03-14 NOTE — ED Provider Notes (Addendum)
[facility] EMERGENCY DEPARTMENT Provider Note   CSN: [id number] Arrival date & time: 03/14/22  1420     History  Chief Complaint  Patient presents with   Chest Pain    Lawrence Carlson is a 66 y.o. male.  Patient with onset of substernal chest pressure at 1300's been constant since.  Nonradiating associated with shortness of breath associated with his as if he cannot get a deep breath.  And also feels as if his tongue is thickened.  Patient is chronically on Xanax and ran out about 3 days ago because his prescription cannot be renewed again until October 25.  Patient is also on Eliquis for past pulmonary embolism.  Patient without any known coronary artery disease.  But has had chest pain in the past.  Past surgical history significant for appendectomy.       Home Medications Prior to Admission medications   Medication Sig Start Date End Date Taking? Authorizing Provider  ALPRAZolam Prudy Feeler) 0.5 MG tablet Take 0.5 mg by mouth at bedtime. 08/31/20   [provider]  apixaban (ELIQUIS) 2.5 MG TABS tablet Take 1 tablet (2.5 mg total) by mouth 2 (two) times daily. 06/29/21 07/29/21  Swinteck, Arlys John, MD  Cyanocobalamin (B-12 PO) Take 1 tablet by mouth daily.    [provider]  HYDROcodone-acetaminophen (NORCO) 5-325 MG tablet Take 1 tablet by mouth every 4 (four) hours as needed for moderate pain. 06/29/21   Swinteck, Arlys John, MD  methocarbamol (ROBAXIN) 500 MG tablet Take 1 tablet (500 mg total) by mouth every 6 (six) hours as needed for muscle spasms. 06/29/21   Swinteck, Arlys John, MD  Multiple Vitamin (MULTIVITAMIN WITH MINERALS) TABS tablet Take 2 tablets by mouth daily.    [provider]  OLANZapine (ZYPREXA) 10 MG tablet Take one tablet 10 mg for 7 days, then take 1.5 tablet 15 mg total daily. 02/22/22   Joan Flores, NP  ondansetron (ZOFRAN) 4 MG tablet Take 1 tablet (4 mg total) by mouth every 8 (eight) hours as needed for nausea or vomiting. 06/29/21    Swinteck, Arlys John, MD  traZODone (DESYREL) 100 MG tablet Take 1.5 tablets (150 mg total) by mouth at bedtime. 01/09/22   Joan Flores, NP      Allergies    Patient has no known allergies.    Review of Systems   Review of Systems  Constitutional:  Negative for chills and fever.  HENT:  Negative for rhinorrhea and sore throat.   Eyes:  Negative for visual disturbance.  Respiratory:  Positive for shortness of breath. Negative for cough.   Cardiovascular:  Positive for chest pain. Negative for leg swelling.  Gastrointestinal:  Negative for abdominal pain, diarrhea, nausea and vomiting.  Genitourinary:  Negative for dysuria.  Musculoskeletal:  Negative for back pain and neck pain.  Skin:  Negative for rash.  Neurological:  Negative for dizziness, light-headedness and headaches.  Hematological:  Does not bruise/bleed easily.  Psychiatric/Behavioral:  Negative for confusion.     Physical Exam Updated Vital Signs BP (!) 146/84   Pulse 75   Temp 97.6 F (36.4 C) (Oral)   Resp 20   Ht 1.803 m (5\' 11" )   Wt 81.6 kg   SpO2 99%   BMI 25.10 kg/m  Physical Exam Vitals and nursing note reviewed.  Constitutional:      General: He is not in acute distress.    Appearance: He is well-developed. He is not ill-appearing.  HENT:     Head:  Normocephalic and atraumatic.     Comments: Mouth no obvious tongue swelling.  No lip swelling.  Uvula midline.  Mucous membranes moist Eyes:     Extraocular Movements: Extraocular movements intact.     Conjunctiva/sclera: Conjunctivae normal.     Pupils: Pupils are equal, round, and reactive to light.  Cardiovascular:     Rate and Rhythm: Normal rate and regular rhythm.     Heart sounds: No murmur heard. Pulmonary:     Effort: Pulmonary effort is normal. No respiratory distress.     Breath sounds: Normal breath sounds. No decreased breath sounds, wheezing, rhonchi or rales.  Chest:     Chest wall: No tenderness.  Abdominal:     Palpations: Abdomen  is soft.     Tenderness: There is no abdominal tenderness. There is no guarding.  Musculoskeletal:        General: No swelling.     Cervical back: Neck supple.     Right lower leg: No edema.     Left lower leg: No edema.  Skin:    General: Skin is warm and dry.     Capillary Refill: Capillary refill takes less than 2 seconds.  Neurological:     General: No focal deficit present.     Mental Status: He is alert and oriented to person, place, and time.     Cranial Nerves: No cranial nerve deficit.     Motor: No weakness.  Psychiatric:        Mood and Affect: Mood normal.     ED Results / Procedures / Treatments   Labs (all labs ordered are listed, but only abnormal results are displayed) Labs Reviewed  BASIC METABOLIC PANEL  CBC  TROPONIN I (HIGH SENSITIVITY)    EKG EKG Interpretation  Date/Time:  Tuesday March 14 2022 14:31:48 EDT Ventricular Rate:  88 PR Interval:  154 QRS Duration: 74 QT Interval:  342 QTC Calculation: 413 R Axis:   89 Text Interpretation: Normal sinus rhythm Normal ECG When compared with ECG of 09-Feb-2022 14:41, PREVIOUS ECG IS PRESENT when compared to prior, similar to prior No STEMI Confirmed by Antony Blackbird [phone]) on 03/14/2022 2:54:24 PM  Radiology DG Chest 2 View  Result Date: 03/14/2022 CLINICAL DATA:  Chest pain EXAM: CHEST - 2 VIEW COMPARISON:  Chest x-ray dated March 12, 2022 FINDINGS: The heart size and mediastinal contours are within normal limits. Both lungs are clear. Advanced degenerative changes of the right shoulder. IMPRESSION: No active cardiopulmonary disease. Electronically Signed   By: Yetta Glassman M.D.   On: 03/14/2022 14:45    Procedures Procedures    Medications Ordered in ED Medications  aspirin chewable tablet 324 mg (has no administration in time range)  LORazepam (ATIVAN) injection 1 mg (has no administration in time range)    ED Course/ Medical Decision Making/ A&P                           Medical  Decision Making Amount and/or Complexity of Data Reviewed Labs: ordered. Radiology: ordered.  Risk OTC drugs. Prescription drug management.   Patient's symptoms could be related to being off of the Xanax.  But will obviously do cardiac work-up to make sure no acute cardiac event.  EKG reassuring.  CBC normal no leukocytosis hemoglobin 64.4 basic metabolic panel normal renal function normal electrolytes normal initial troponin pending will need delta troponins based on the onset time of the chest pressure.  Chest x-ray  no acute cardiopulmonary process.  We will also give patient IV Ativan here.  Chest pain and all symptoms resolved with the Ativan.  Patient's work-up here troponins negative.  Patient metabolic panel normal CBC normal troponins x2 5 chest x-ray no active cardiopulmonary disease.  EKG without any acute changes.  Clinically suspect this has to do with a little bit of benzo diazepam withdrawal.  However we will have patient follow-up with cardiology will give a referral.  We will also give a short course of Ativan to bridge the gap in the meantime.   Final Clinical Impression(s) / ED Diagnoses Final diagnoses:  Precordial pain    Rx / DC Orders ED Discharge Orders     None         Vanetta Mulders, MD 03/14/22 1531    Vanetta Mulders, MD 03/14/22 [phone]

## 2022-03-16 ENCOUNTER — Ambulatory Visit: Payer: Medicare HMO | Admitting: Behavioral Health

## 2022-03-20 ENCOUNTER — Other Ambulatory Visit: Payer: Self-pay | Admitting: Behavioral Health

## 2022-03-20 DIAGNOSIS — F319 Bipolar disorder, unspecified: Secondary | ICD-10-CM

## 2022-03-20 DIAGNOSIS — F3130 Bipolar disorder, current episode depressed, mild or moderate severity, unspecified: Secondary | ICD-10-CM

## 2022-03-20 DIAGNOSIS — F0630 Mood disorder due to known physiological condition, unspecified: Secondary | ICD-10-CM

## 2022-03-28 ENCOUNTER — Other Ambulatory Visit: Payer: Self-pay | Admitting: Family Medicine

## 2022-03-28 DIAGNOSIS — R4182 Altered mental status, unspecified: Secondary | ICD-10-CM

## 2022-04-06 ENCOUNTER — Other Ambulatory Visit: Payer: Self-pay

## 2022-04-06 ENCOUNTER — Encounter (HOSPITAL_BASED_OUTPATIENT_CLINIC_OR_DEPARTMENT_OTHER): Payer: Self-pay

## 2022-04-06 ENCOUNTER — Emergency Department (HOSPITAL_BASED_OUTPATIENT_CLINIC_OR_DEPARTMENT_OTHER): Payer: Medicare HMO

## 2022-04-06 ENCOUNTER — Encounter (HOSPITAL_COMMUNITY): Payer: Self-pay

## 2022-04-06 ENCOUNTER — Emergency Department (HOSPITAL_BASED_OUTPATIENT_CLINIC_OR_DEPARTMENT_OTHER)
Admission: EM | Admit: 2022-04-06 | Discharge: 2022-04-06 | Disposition: A | Discharge status: Home / Self Care | Payer: Medicare HMO | Attending: Emergency Medicine | Admitting: Emergency Medicine

## 2022-04-06 DIAGNOSIS — R0789 Other chest pain: Secondary | ICD-10-CM | POA: Insufficient documentation

## 2022-04-06 DIAGNOSIS — Z7901 Long term (current) use of anticoagulants: Secondary | ICD-10-CM | POA: Insufficient documentation

## 2022-04-06 DIAGNOSIS — F13930 Sedative, hypnotic or anxiolytic use, unspecified with withdrawal, uncomplicated: Secondary | ICD-10-CM | POA: Diagnosis not present

## 2022-04-06 DIAGNOSIS — R079 Chest pain, unspecified: Secondary | ICD-10-CM

## 2022-04-06 LAB — BASIC METABOLIC PANEL
Anion gap: 8 (ref 5–15)
BUN: 17 mg/dL (ref 8–23)
CO2: 29 mmol/L (ref 22–32)
Calcium: 9.4 mg/dL (ref 8.9–10.3)
Chloride: 106 mmol/L (ref 98–111)
Creatinine, Ser: 0.94 mg/dL (ref 0.61–1.24)
GFR, Estimated: >60 mL/min (ref >60)
Glucose, Bld: 105 mg/dL — ABNORMAL HIGH (ref 70–99)
Potassium: 3.9 mmol/L (ref 3.5–5.1)
Sodium: 143 mmol/L (ref 135–145)

## 2022-04-06 LAB — CBC
HCT: 46 % (ref 39.0–52.0)
Hemoglobin: 15.9 g/dL (ref 13.0–17.0)
MCH: 32.3 pg (ref 26.0–34.0)
MCHC: 34.6 g/dL (ref 30.0–36.0)
MCV: 93.5 fL (ref 80.0–100.0)
Platelets: 196 10*3/uL (ref 150–400)
RBC: 4.92 MIL/uL (ref 4.22–5.81)
RDW: 12.4 % (ref 11.5–15.5)
WBC: 5 10*3/uL (ref 4.0–10.5)
nRBC: 0 % (ref 0.0–0.2)

## 2022-04-06 LAB — TROPONIN I (HIGH SENSITIVITY)
Troponin I (High Sensitivity): 5 ng/L (ref <18)
Troponin I (High Sensitivity): 5 ng/L (ref <18)

## 2022-04-06 MED ORDER — IOHEXOL 350 MG/ML SOLN
100.0000 mL | Freq: Once | INTRAVENOUS | Status: AC | PRN
  Administered 2022-04-06: 75 mL via INTRAVENOUS

## 2022-04-06 MED ORDER — FOLIC ACID 1 MG PO TABS: 1.0000 mg | ORAL_TABLET | Freq: Every day | ORAL | Status: DC

## 2022-04-06 MED ORDER — LORAZEPAM 1 MG PO TABS: 0.0000 mg | ORAL_TABLET | Freq: Two times a day (BID) | ORAL | Status: DC

## 2022-04-06 MED ORDER — ASPIRIN 81 MG PO CHEW
324.0000 mg | CHEWABLE_TABLET | Freq: Once | ORAL | Status: AC
  Administered 2022-04-06: 324 mg via ORAL
  Filled 2022-04-06: qty 4

## 2022-04-06 MED ORDER — THIAMINE MONONITRATE 100 MG PO TABS
100.0000 mg | ORAL_TABLET | Freq: Every day | ORAL | Status: DC
  Administered 2022-04-06: 100 mg via ORAL
  Filled 2022-04-06: qty 1

## 2022-04-06 MED ORDER — THIAMINE HCL 100 MG/ML IJ SOLN: 100.0000 mg | Freq: Every day | INTRAMUSCULAR | Status: DC

## 2022-04-06 MED ORDER — ADULT MULTIVITAMIN W/MINERALS CH
1.0000 | ORAL_TABLET | Freq: Every day | ORAL | Status: DC
  Administered 2022-04-06: 1 via ORAL
  Filled 2022-04-06: qty 1

## 2022-04-06 MED ORDER — LORAZEPAM 1 MG PO TABS: 0.0000 mg | ORAL_TABLET | Freq: Four times a day (QID) | ORAL | Status: DC

## 2022-04-06 MED ORDER — LORAZEPAM 2 MG/ML IJ SOLN: 1.0000 mg | INTRAMUSCULAR | Status: DC | PRN

## 2022-04-06 MED ORDER — LORAZEPAM 1 MG PO TABS
1.0000 mg | ORAL_TABLET | Freq: Once | ORAL | Status: AC
  Administered 2022-04-06: 1 mg via ORAL
  Filled 2022-04-06: qty 1

## 2022-04-06 MED ORDER — LORAZEPAM 1 MG PO TABS: 1.0000 mg | ORAL_TABLET | ORAL | Status: DC | PRN

## 2022-04-06 NOTE — ED Triage Notes (Signed)
Pt c/o chest pain and a "lump in my throat" since this morning. Pt c/o left sided chest pressure. Pt reports this feels similar to 3 weeks ago when he was seen for the same.

## 2022-04-06 NOTE — ED Notes (Signed)
Patient transported to CT 

## 2022-04-06 NOTE — Discharge Instructions (Addendum)
You are leaving AGAINST MEDICAL ADVICE.  Admission was recommended for further evaluation of your chest pain.  Heart attack and coronary artery disease has not been ruled out.  Return to the ED if you wish to be reevaluated

## 2022-04-06 NOTE — ED Notes (Signed)
Pt expressed not wanting to be admitted. Has a Psych Pharm appt tomorrow he doesn't want to miss. Wants to reschedule Cards stress test for outpatient.

## 2022-04-06 NOTE — ED Notes (Signed)
Blue and yellow tube pulled. UA acquired.

## 2022-04-06 NOTE — ED Notes (Signed)
Updated EDP about pt preference.

## 2022-04-06 NOTE — ED Provider Notes (Signed)
[facility] EMERGENCY DEPARTMENT Provider Note   CSN: [id number] Arrival date & time: 04/06/22  1200     History  Chief Complaint  Patient presents with   Chest Pain    Lawrence Carlson is a 66 y.o. male.  Patient with a history of depression and anxiety, previous pulmonary embolism no longer anticoagulated, presenting with left-sided chest pain that he woke up with around 10 AM.  Pain is fairly constant but waxes and wanes in severity.  Similar to when he was seen in the ED October 17th and presentation was thought to be consistent with anxiety.  Denies any cardiac history.  This pain feels similar.  He feels a lump in his throat and chest pressure to the left side.  Pain does not radiate to his arm, neck or back.  Some associated shortness of breath.  No nausea or vomiting.  Did have diaphoresis on arrival.  No abdominal pain. He has been out of his Xanax for several days and has been taking it more than prescribed.  He is supposed to be taking 0.5 mg daily but has been taking it 2-3 times daily.  Denies any other alcohol or drug use.  The history is provided by the patient.  Chest Pain Associated symptoms: shortness of breath   Associated symptoms: no abdominal pain, no dizziness, no fever, no headache, no nausea, no vomiting and no weakness        Home Medications Prior to Admission medications   Medication Sig Start Date End Date Taking? Authorizing Provider  ALPRAZolam Prudy Feeler) 0.5 MG tablet Take 0.5 mg by mouth at bedtime. 08/31/20   [provider]  apixaban (ELIQUIS) 2.5 MG TABS tablet Take 1 tablet (2.5 mg total) by mouth 2 (two) times daily. 06/29/21 07/29/21  Swinteck, Arlys John, MD  Cyanocobalamin (B-12 PO) Take 1 tablet by mouth daily.    [provider]  HYDROcodone-acetaminophen (NORCO) 5-325 MG tablet Take 1 tablet by mouth every 4 (four) hours as needed for moderate pain. 06/29/21   Swinteck, Arlys John, MD  LORazepam (ATIVAN) 1 MG tablet Take 1 tablet (1 mg  total) by mouth 3 (three) times daily as needed for anxiety. 03/14/22   Vanetta Mulders, MD  methocarbamol (ROBAXIN) 500 MG tablet Take 1 tablet (500 mg total) by mouth every 6 (six) hours as needed for muscle spasms. 06/29/21   Swinteck, Arlys John, MD  Multiple Vitamin (MULTIVITAMIN WITH MINERALS) TABS tablet Take 2 tablets by mouth daily.    [provider]  OLANZapine (ZYPREXA) 10 MG tablet TAKE ONE AND ONE-HALF TABLETS (15 MG TOTAL) BY MOUTH DAILY 03/20/22   White, Arlys John A, NP  ondansetron (ZOFRAN) 4 MG tablet Take 1 tablet (4 mg total) by mouth every 8 (eight) hours as needed for nausea or vomiting. 06/29/21   Swinteck, Arlys John, MD  traZODone (DESYREL) 100 MG tablet TAKE ONE AND ONE-HALF TABLETS BY MOUTH AT BEDTIME 03/20/22   Joan Flores, NP      Allergies    Patient has no known allergies.    Review of Systems   Review of Systems  Constitutional:  Negative for activity change, appetite change and fever.  HENT:  Negative for congestion and rhinorrhea.   Respiratory:  Positive for chest tightness and shortness of breath.   Cardiovascular:  Positive for chest pain.  Gastrointestinal:  Negative for abdominal pain, nausea and vomiting.  Genitourinary:  Negative for dysuria and hematuria.  Musculoskeletal:  Negative for arthralgias and myalgias.  Skin:  Negative for  rash.  Neurological:  Negative for dizziness, weakness and headaches.   all other systems are negative except as noted in the HPI and PMH.    Physical Exam Updated Vital Signs Ht 5\' 11"  (1.803 m)   Wt 81.6 kg   BMI 25.10 kg/m  Physical Exam Vitals and nursing note reviewed.  Constitutional:      General: He is not in acute distress.    Appearance: He is well-developed.     Comments: Anxious appearing  HENT:     Head: Normocephalic and atraumatic.     Mouth/Throat:     Pharynx: No oropharyngeal exudate.  Eyes:     Conjunctiva/sclera: Conjunctivae normal.     Pupils: Pupils are equal, round, and reactive to  light.  Neck:     Comments: No meningismus. Cardiovascular:     Rate and Rhythm: Normal rate and regular rhythm.     Heart sounds: Normal heart sounds. No murmur heard. Pulmonary:     Effort: Pulmonary effort is normal. No respiratory distress.     Breath sounds: Normal breath sounds.  Chest:     Chest wall: No tenderness.  Abdominal:     Palpations: Abdomen is soft.     Tenderness: There is no abdominal tenderness. There is no guarding or rebound.  Musculoskeletal:        General: No tenderness. Normal range of motion.     Cervical back: Normal range of motion and neck supple.  Skin:    General: Skin is warm.     Capillary Refill: Capillary refill takes less than 2 seconds.  Neurological:     General: No focal deficit present.     Mental Status: He is alert and oriented to person, place, and time. Mental status is at baseline.     Cranial Nerves: No cranial nerve deficit.     Motor: No abnormal muscle tone.     Coordination: Coordination normal.     Comments:  5/5 strength throughout. CN 2-12 intact.Equal grip strength.   Psychiatric:        Behavior: Behavior normal.     ED Results / Procedures / Treatments   Labs (all labs ordered are listed, but only abnormal results are displayed) Labs Reviewed  BASIC METABOLIC PANEL - Abnormal; Notable for the following components:      Result Value   Glucose, Bld 105 (*)    All other components within normal limits  CBC  TROPONIN I (HIGH SENSITIVITY)  TROPONIN I (HIGH SENSITIVITY)    EKG EKG Interpretation  Date/Time:  Thursday April 06 2022 12:31:14 EST Ventricular Rate:  81 PR Interval:  53 QRS Duration: 88 QT Interval:  382 QTC Calculation: 444 R Axis:   64 Text Interpretation: Sinus rhythm Short PR interval Borderline low voltage, extremity leads Minimal ST elevation, inferior leads No significant change was found Confirmed by [phone] [id number]) on 04/06/2022 12:36:11 PM  Radiology CT Angio Chest PE W  and/or Wo Contrast  Result Date: 04/06/2022 CLINICAL DATA:  Pulmonary embolism (PE) suspected, positive D-dimer. Chest pain EXAM: CT ANGIOGRAPHY CHEST WITH CONTRAST TECHNIQUE: Multidetector CT imaging of the chest was performed using the standard protocol during bolus administration of intravenous contrast. Multiplanar CT image reconstructions and MIPs were obtained to evaluate the vascular anatomy. RADIATION DOSE REDUCTION: This exam was performed according to the departmental dose-optimization program which includes automated exposure control, adjustment of the mA and/or kV according to patient size and/or use of iterative reconstruction technique. CONTRAST:  67mL OMNIPAQUE  IOHEXOL 350 MG/ML SOLN COMPARISON:  07/25/2015 FINDINGS: Cardiovascular: No filling defects in the pulmonary arteries to suggest pulmonary emboli. Heart is normal size. Aorta is normal caliber. Few scattered aortic calcifications. Mediastinum/Nodes: No mediastinal, hilar, or axillary adenopathy. Trachea and esophagus are unremarkable. Thyroid unremarkable. Lungs/Pleura: Lungs are clear. No focal airspace opacities or suspicious nodules. No effusions. Upper Abdomen: No acute findings Musculoskeletal: Chest wall soft tissues are unremarkable. Review of the MIP images confirms the above findings. IMPRESSION: No acute bony abnormality. No evidence of pulmonary embolus. No acute cardiopulmonary disease. Aortic Atherosclerosis (ICD10-I70.0). Electronically Signed   By: Charlett Nose M.D.   On: 04/06/2022 14:21    Procedures Procedures    Medications Ordered in ED Medications  LORazepam (ATIVAN) tablet 1 mg (has no administration in time range)  aspirin chewable tablet 324 mg (has no administration in time range)    ED Course/ Medical Decision Making/ A&P                           Medical Decision Making Amount and/or Complexity of Data Reviewed Labs: ordered. Decision-making details documented in ED Course. Radiology: ordered and  independent interpretation performed. Decision-making details documented in ED Course. ECG/medicine tests: ordered and independent interpretation performed. Decision-making details documented in ED Course.  Risk OTC drugs. Prescription drug management.   Left-sided chest pain since 10 AM waxing and waning in severity.  Diaphoretic and anxious on arrival.  EKG without acute ST elevation.  Given aspirin as well as p.o. Ativan.  Discussed with patient he is taking his benzodiazepine too often.  Narcotic database reviewed.  Patient received prescription for 30 Xanax tablets on October 24.  He also received a prescription from the ER on October 17 for 9 tablets of ativan.  Troponin negative x2.  CT negative for pulmonary embolism. Heart score is 4.  Patient requesting more benzodiazepines.  Discussed that ED cannot refill these as patient just received a prescription for them less than 2 weeks ago.  Admission recommended for chest pain evaluation as well as possible benzodiazepine withdrawal. Chest pain has improved. Low suspicion for PE or aortic dissection.  Patient states he does not want to be admitted.  He has a psychiatry appointment tomorrow.  Discussed with the patient that CAD has not been ruled out and he could be at risk for heart attack which could be life-threatening.  Patient adamant that he wants to be discharged.  Does not want to be admitted.  Discussed that we cannot provide further prescriptions for benzodiazepines. He appears to have capacity to make this decision.  He is not suicidal or homicidal.  Does not appear to responding to internal stimuli  He will leave AGAINST MEDICAL ADVICE.  Cardiology referral was given.  Return precautions discussed       Final Clinical Impression(s) / ED Diagnoses Final diagnoses:  Nonspecific chest pain  Benzodiazepine withdrawal without complication [facility])    Rx / DC Orders ED Discharge Orders     None         Phill Steck,  Jeannett Senior, MD 04/06/22 1545

## 2022-04-07 ENCOUNTER — Ambulatory Visit: Payer: Medicare HMO | Admitting: Behavioral Health

## 2022-04-11 ENCOUNTER — Ambulatory Visit: Payer: Medicare HMO | Admitting: Behavioral Health

## 2022-04-11 DIAGNOSIS — F411 Generalized anxiety disorder: Secondary | ICD-10-CM

## 2022-04-11 DIAGNOSIS — F410 Panic disorder [episodic paroxysmal anxiety] without agoraphobia: Secondary | ICD-10-CM | POA: Diagnosis not present

## 2022-04-11 DIAGNOSIS — F3130 Bipolar disorder, current episode depressed, mild or moderate severity, unspecified: Secondary | ICD-10-CM | POA: Diagnosis not present

## 2022-04-11 DIAGNOSIS — F99 Mental disorder, not otherwise specified: Secondary | ICD-10-CM

## 2022-04-11 DIAGNOSIS — F0630 Mood disorder due to known physiological condition, unspecified: Secondary | ICD-10-CM | POA: Diagnosis not present

## 2022-04-11 DIAGNOSIS — F5105 Insomnia due to other mental disorder: Secondary | ICD-10-CM

## 2022-04-11 DIAGNOSIS — F319 Bipolar disorder, unspecified: Secondary | ICD-10-CM | POA: Diagnosis not present

## 2022-04-11 MED ORDER — TRAZODONE HCL 100 MG PO TABS: ORAL_TABLET | ORAL | 1 refills | Status: DC

## 2022-04-11 MED ORDER — LORAZEPAM 1 MG PO TABS: 1.0000 mg | ORAL_TABLET | Freq: Three times a day (TID) | ORAL | 3 refills | Status: DC

## 2022-04-11 MED ORDER — OLANZAPINE 10 MG PO TABS: ORAL_TABLET | ORAL | 3 refills | Status: DC

## 2022-04-11 MED ORDER — VILAZODONE HCL 20 MG PO TABS: 20.0000 mg | ORAL_TABLET | Freq: Every day | ORAL | 1 refills | Status: DC
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
## 2022-04-12 ENCOUNTER — Encounter: Payer: Self-pay | Admitting: Behavioral Health

## 2022-04-19 ENCOUNTER — Ambulatory Visit: Payer: Medicare HMO | Admitting: Behavioral Health

## 2022-05-09 ENCOUNTER — Ambulatory Visit: Payer: Medicare HMO | Admitting: Behavioral Health

## 2022-05-13 ENCOUNTER — Other Ambulatory Visit: Payer: Self-pay | Admitting: Behavioral Health

## 2022-05-13 DIAGNOSIS — F410 Panic disorder [episodic paroxysmal anxiety] without agoraphobia: Secondary | ICD-10-CM

## 2022-05-13 DIAGNOSIS — F411 Generalized anxiety disorder: Secondary | ICD-10-CM

## 2022-05-15 ENCOUNTER — Other Ambulatory Visit: Payer: Self-pay

## 2022-05-15 ENCOUNTER — Encounter (HOSPITAL_BASED_OUTPATIENT_CLINIC_OR_DEPARTMENT_OTHER): Payer: Self-pay | Admitting: Urology

## 2022-05-15 ENCOUNTER — Emergency Department (HOSPITAL_BASED_OUTPATIENT_CLINIC_OR_DEPARTMENT_OTHER)
Admission: EM | Admit: 2022-05-15 | Discharge: 2022-05-16 | Discharge status: Against Medical Advice | Payer: Medicare HMO | Attending: Emergency Medicine | Admitting: Emergency Medicine

## 2022-05-15 DIAGNOSIS — Z20822 Contact with and (suspected) exposure to covid-19: Secondary | ICD-10-CM | POA: Diagnosis not present

## 2022-05-15 DIAGNOSIS — R110 Nausea: Secondary | ICD-10-CM | POA: Insufficient documentation

## 2022-05-15 DIAGNOSIS — R42 Dizziness and giddiness: Secondary | ICD-10-CM | POA: Insufficient documentation

## 2022-05-15 DIAGNOSIS — R1084 Generalized abdominal pain: Secondary | ICD-10-CM | POA: Insufficient documentation

## 2022-05-15 DIAGNOSIS — Z5321 Procedure and treatment not carried out due to patient leaving prior to being seen by health care provider: Secondary | ICD-10-CM | POA: Insufficient documentation

## 2022-05-15 LAB — COMPREHENSIVE METABOLIC PANEL
ALT: 23 U/L (ref 0–44)
AST: 26 U/L (ref 15–41)
Albumin: 3.8 g/dL (ref 3.5–5.0)
Alkaline Phosphatase: 75 U/L (ref 38–126)
Anion gap: 6 (ref 5–15)
BUN: 15 mg/dL (ref 8–23)
CO2: 28 mmol/L (ref 22–32)
Calcium: 9.3 mg/dL (ref 8.9–10.3)
Chloride: 107 mmol/L (ref 98–111)
Creatinine, Ser: 0.89 mg/dL (ref 0.61–1.24)
GFR, Estimated: >60 mL/min (ref >60)
Glucose, Bld: 99 mg/dL (ref 70–99)
Potassium: 3.5 mmol/L (ref 3.5–5.1)
Sodium: 141 mmol/L (ref 135–145)
Total Bilirubin: 0.6 mg/dL (ref 0.3–1.2)
Total Protein: 7.3 g/dL (ref 6.5–8.1)

## 2022-05-15 LAB — CBC
HCT: 44 % (ref 39.0–52.0)
Hemoglobin: 15.3 g/dL (ref 13.0–17.0)
MCH: 32 pg (ref 26.0–34.0)
MCHC: 34.8 g/dL (ref 30.0–36.0)
MCV: 92.1 fL (ref 80.0–100.0)
Platelets: 195 10*3/uL (ref 150–400)
RBC: 4.78 MIL/uL (ref 4.22–5.81)
RDW: 12.5 % (ref 11.5–15.5)
WBC: 5.3 10*3/uL (ref 4.0–10.5)
nRBC: 0 % (ref 0.0–0.2)

## 2022-05-15 LAB — URINALYSIS, ROUTINE W REFLEX MICROSCOPIC
Bilirubin Urine: NEGATIVE
Glucose, UA: NEGATIVE mg/dL
Hgb urine dipstick: NEGATIVE
Ketones, ur: NEGATIVE mg/dL
Leukocytes,Ua: NEGATIVE
Nitrite: NEGATIVE
Protein, ur: NEGATIVE mg/dL
Specific Gravity, Urine: 1.02 (ref 1.005–1.030)
pH: 7 (ref 5.0–8.0)

## 2022-05-15 LAB — LIPASE, BLOOD: Lipase: 29 U/L (ref 11–51)

## 2022-05-15 LAB — RESP PANEL BY RT-PCR (RSV, FLU A&B, COVID)  RVPGX2
Influenza A by PCR: NEGATIVE
Influenza B by PCR: NEGATIVE
Resp Syncytial Virus by PCR: NEGATIVE
SARS Coronavirus 2 by RT PCR: NEGATIVE

## 2022-05-15 MED ORDER — ONDANSETRON 4 MG PO TBDP
4.0000 mg | ORAL_TABLET | Freq: Once | ORAL | Status: AC
  Administered 2022-05-15: 4 mg via ORAL
  Filled 2022-05-15: qty 1

## 2022-05-15 NOTE — ED Triage Notes (Signed)
Pt states generalized abd pain, nausea, ha, and lightheadedness that started this am  Denies fever

## 2022-05-15 NOTE — Telephone Encounter (Signed)
Pt called requesting RF, also.

## 2022-05-19 ENCOUNTER — Ambulatory Visit: Payer: Medicare HMO | Admitting: Behavioral Health

## 2022-05-20 IMAGING — DX DG KNEE 1-2V PORT*L*
2 series · 2 of 2 positions shown · non-contrast
Comparison: None.

CLINICAL DATA: Status post total knee replacement.

EXAM:
PORTABLE LEFT KNEE - 1-2 VIEW

[knee ap]
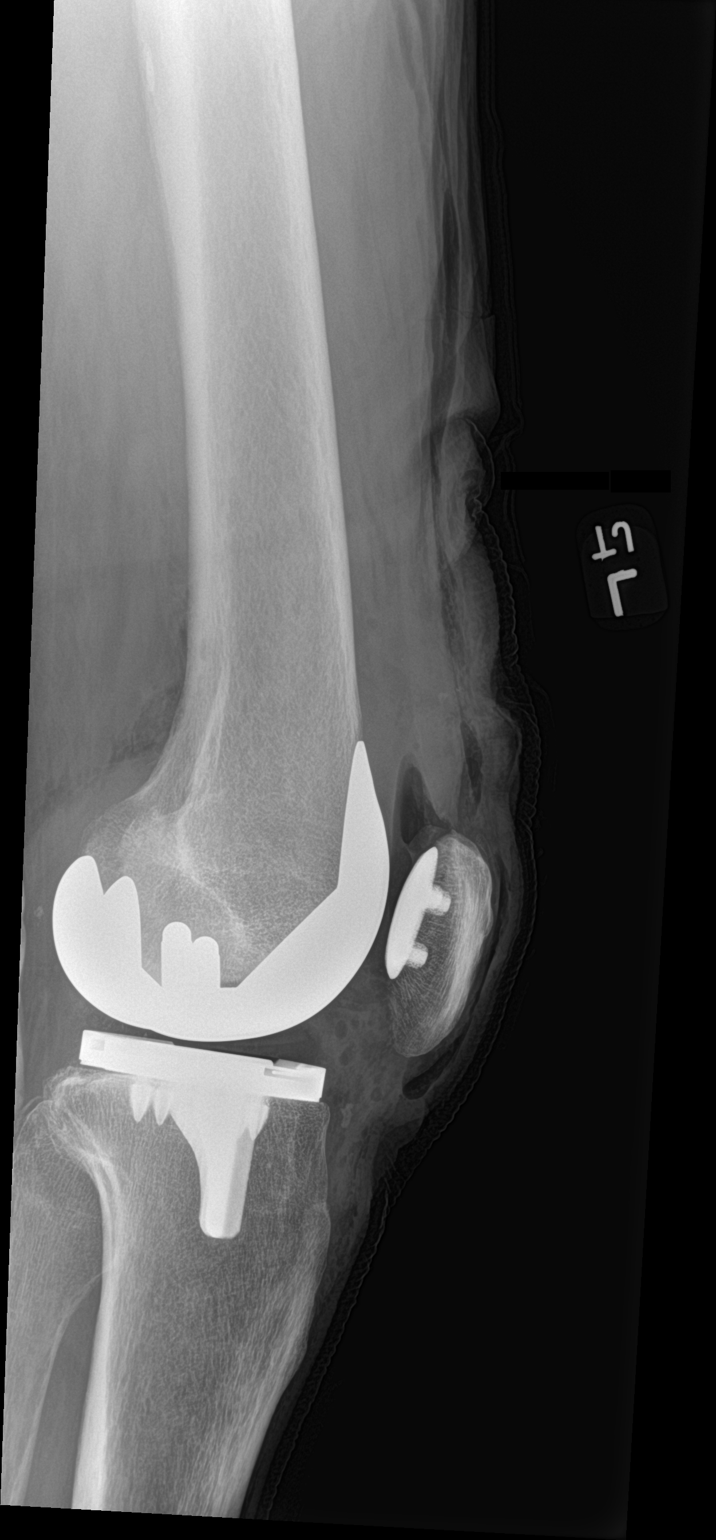

[knee lat]
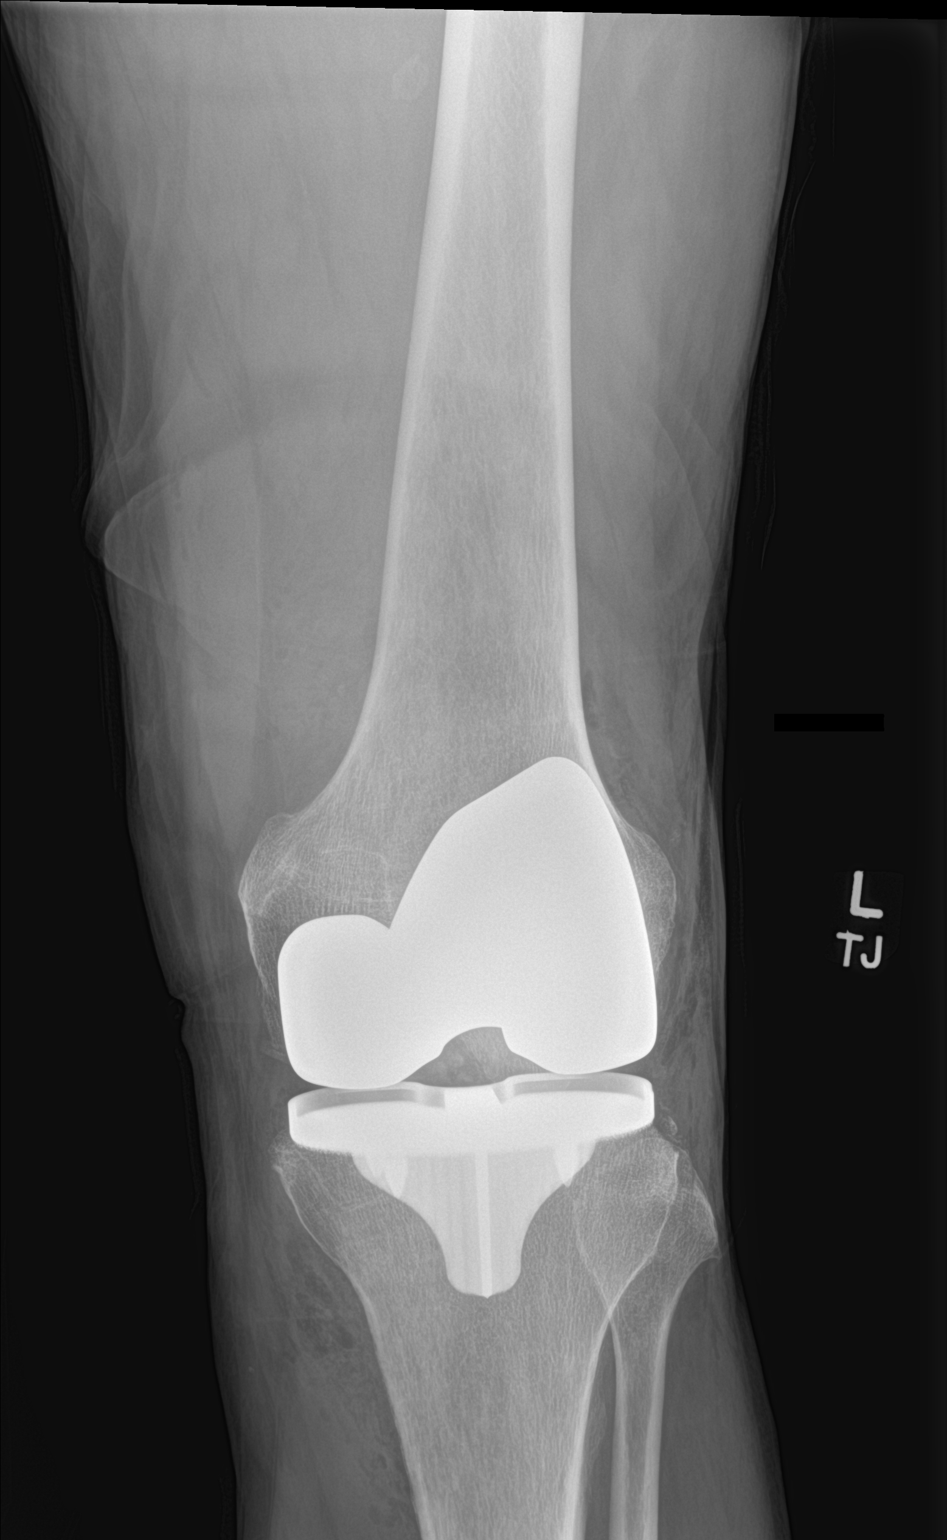

[2 of 2 positions shown; findings below may reference images not displayed]

FINDINGS: Two-view exam shows tricompartmental knee replacement without
evidence for immediate hardware complication. Gas in the soft
tissues and joint space is compatible with the immediate
postoperative state.
IMPRESSION: Status post left total knee replacement without evidence for
immediate hardware complications.

## 2022-05-23 IMAGING — DX DG CHEST 2V
2 series · 2 of 2 positions shown · non-contrast
Comparison: 03/11/2017

CLINICAL DATA: Suspected infection.

EXAM:
CHEST - 2 VIEW

[chest pa]
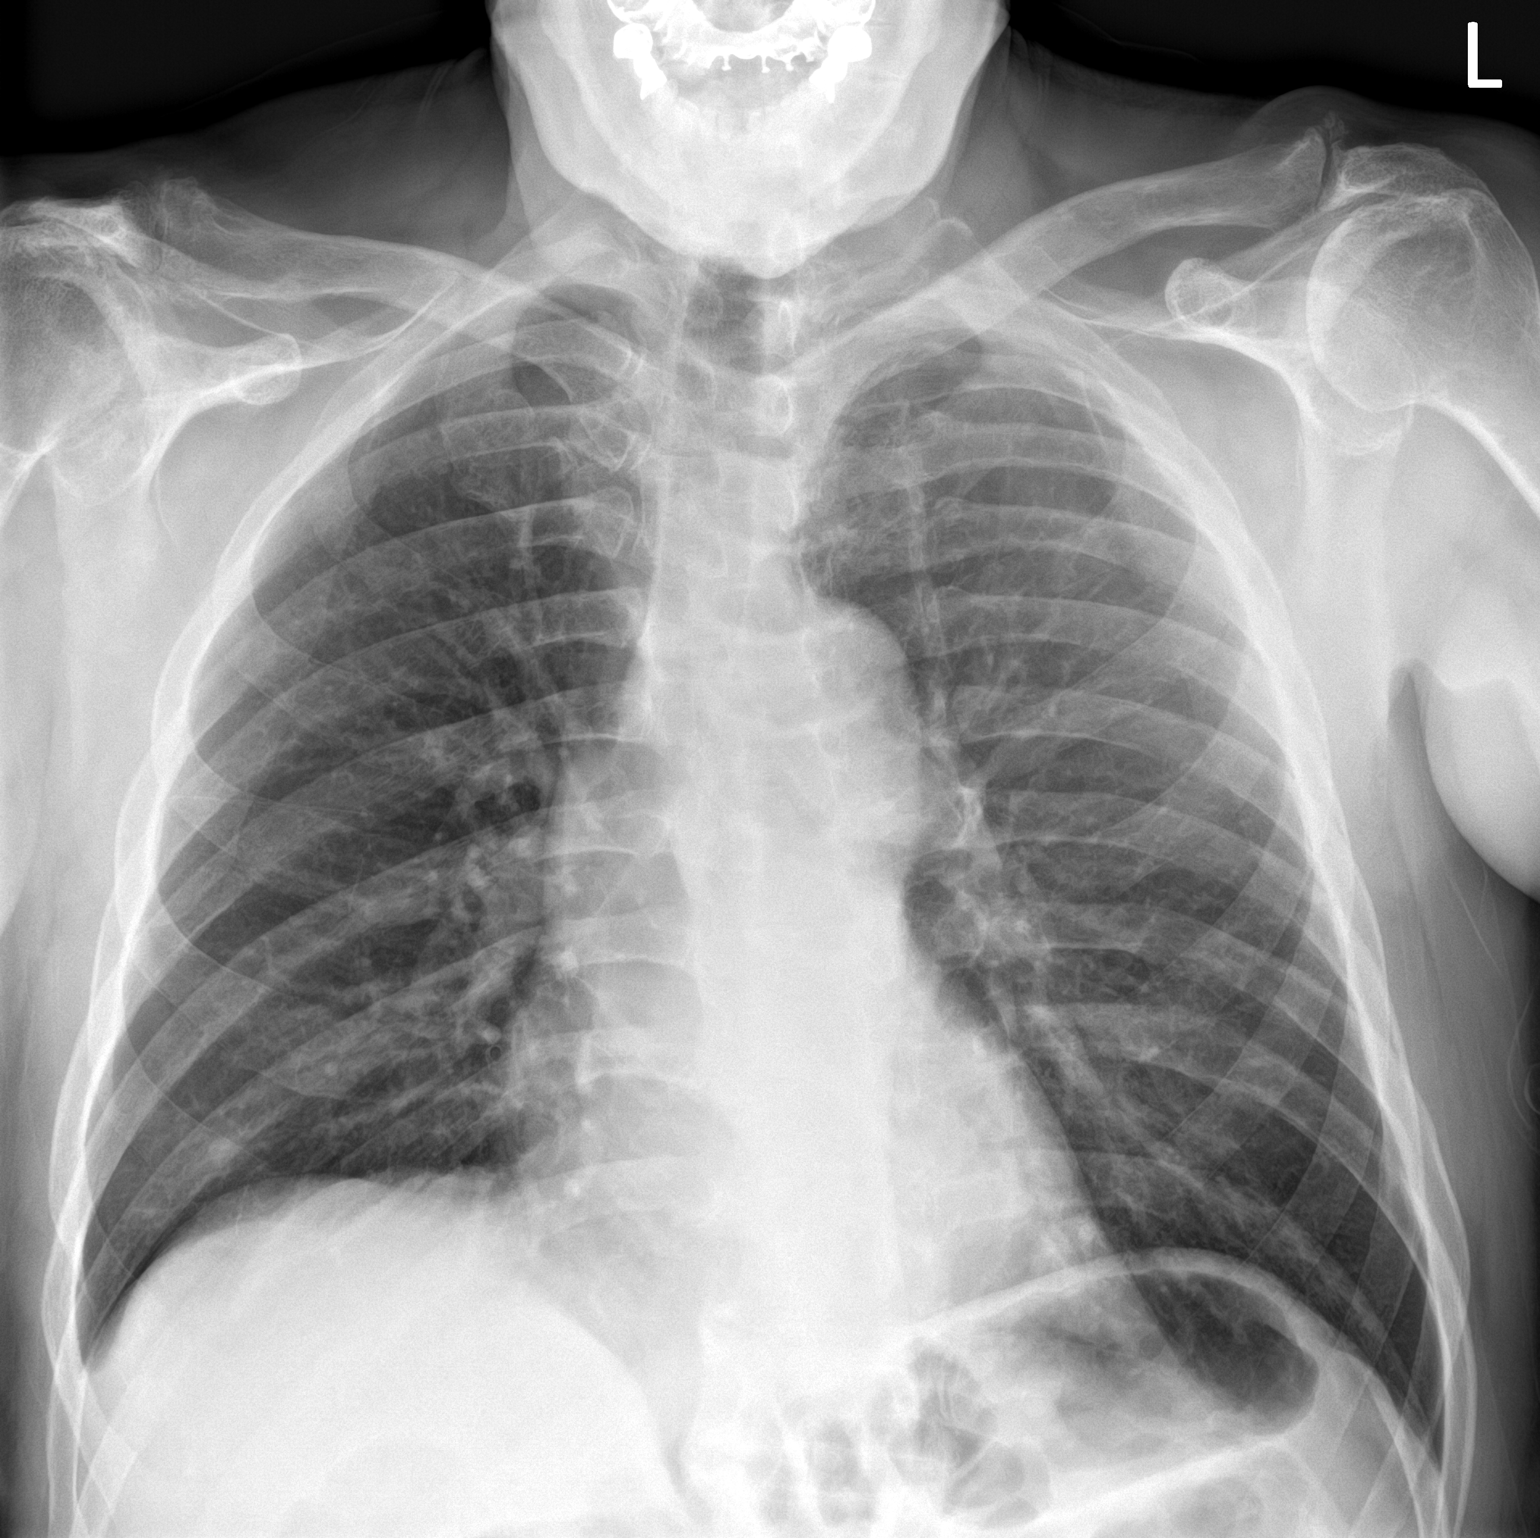

[chest lat]
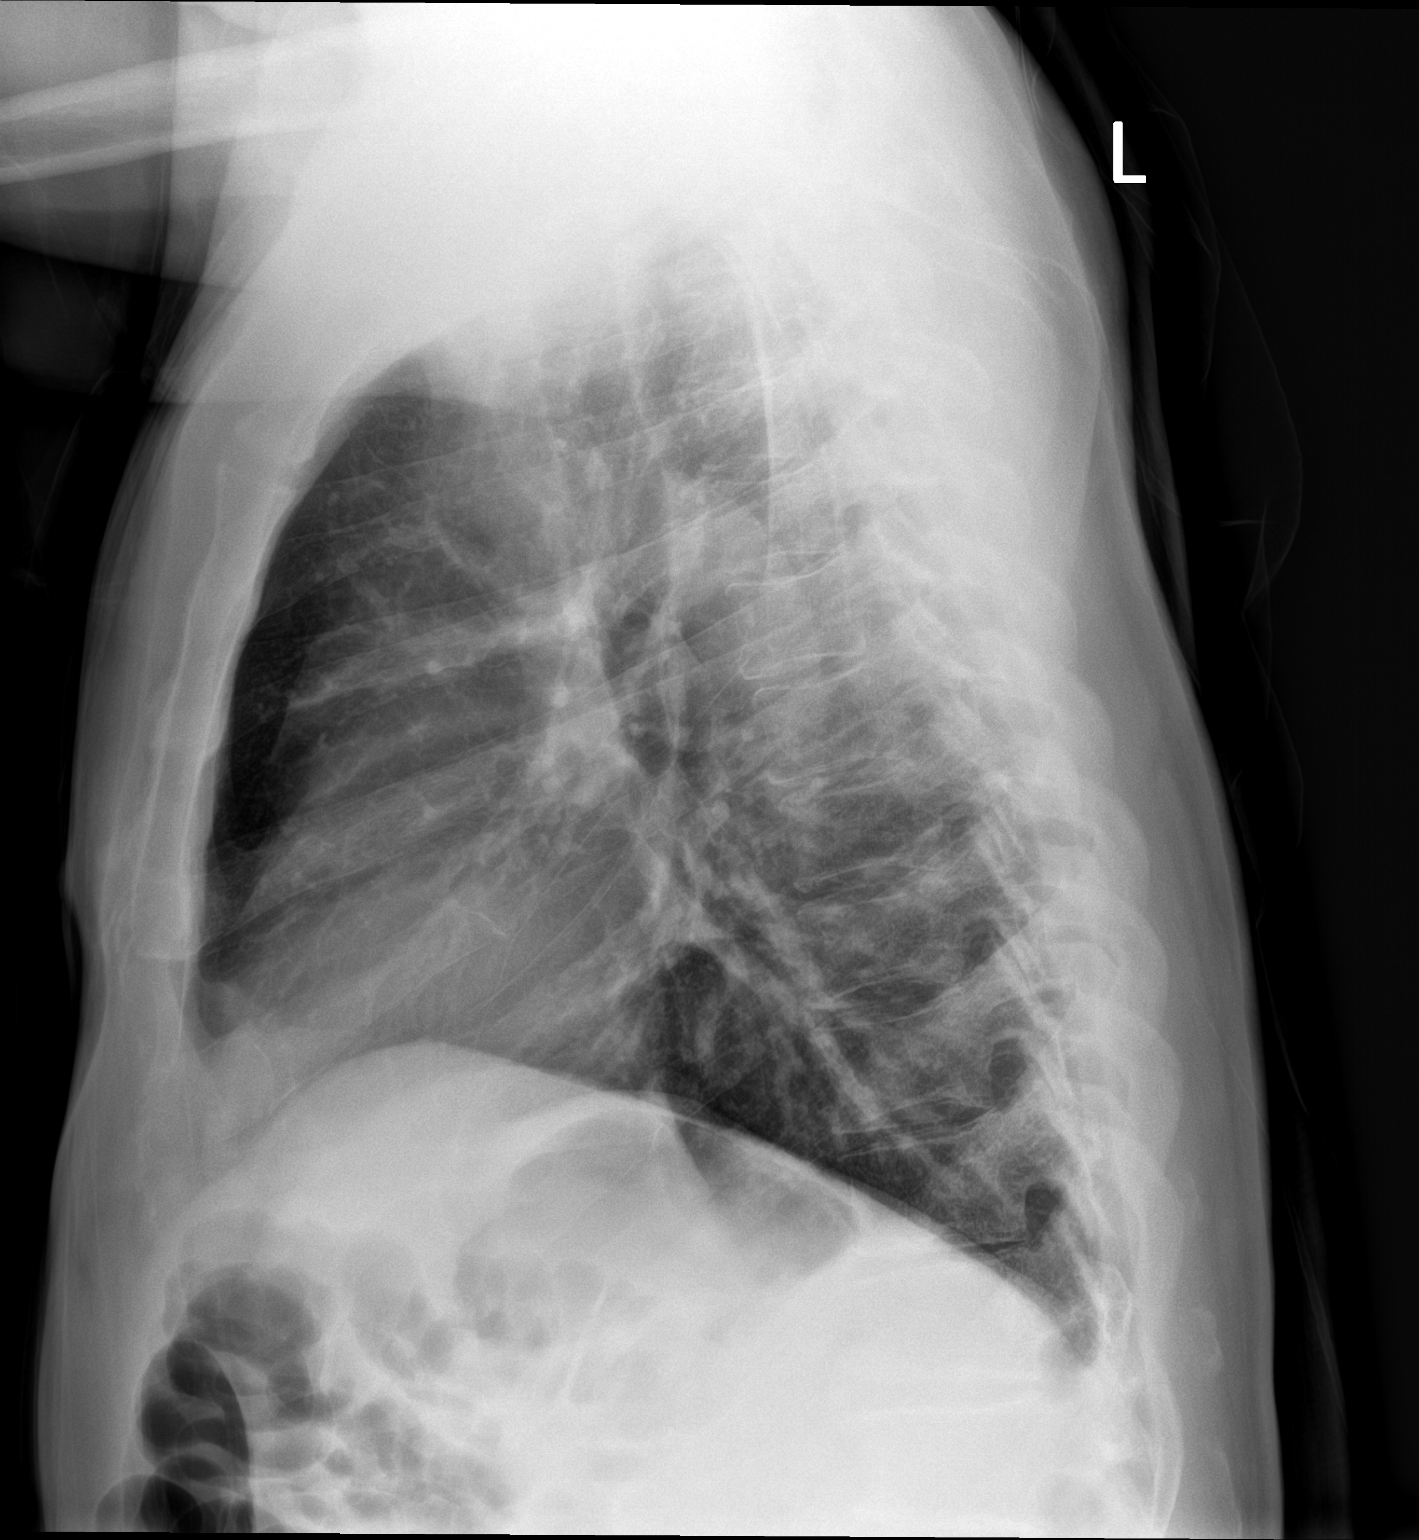

[2 of 2 positions shown; findings below may reference images not displayed]

FINDINGS: Heart and mediastinal contours are within normal limits. No focal
opacities or effusions. No acute bony abnormality.
IMPRESSION: No active cardiopulmonary disease.

## 2022-05-23 IMAGING — US US EXTREM LOW VENOUS*L*
1 series · 13 of 24 positions shown · non-contrast
Comparison: None.

CLINICAL DATA: Left total knee arthroplasty 3 days ago. Now with
swelling and pain at the incision. History of previous DVT.

EXAM:
Left LOWER EXTREMITY VENOUS DOPPLER ULTRASOUND
TECHNIQUE: Gray-scale sonography with compression, as well as color and duplex
ultrasound, were performed to evaluate the deep venous system(s)
from the level of the common femoral vein through the popliteal and
proximal calf veins.

[Series 1: us venous img lower uni left (dvt) · portal-venous · 13 of 66 slices shown]
[im 1/66]
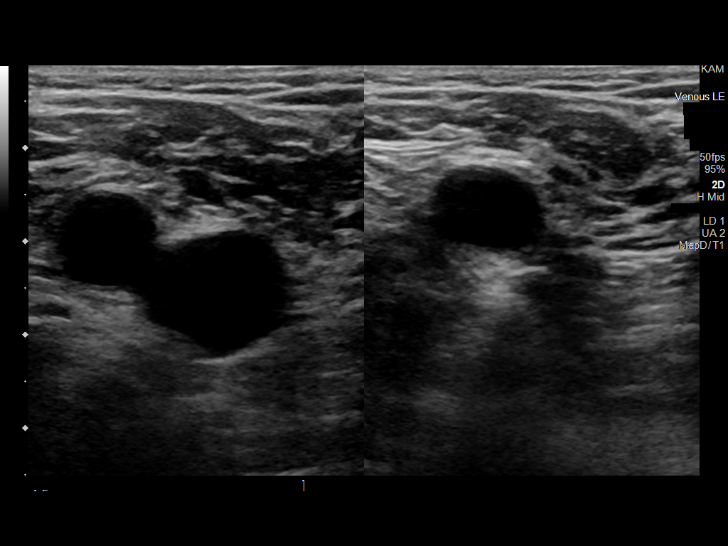
[im 6/66]
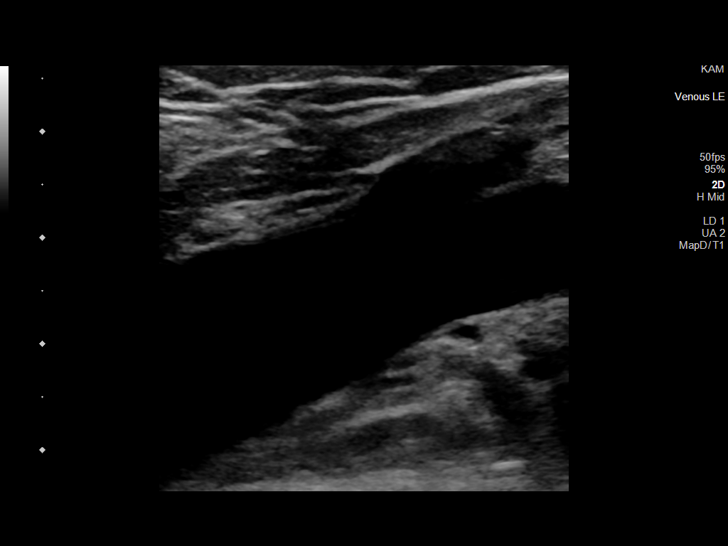
[im 12/66]
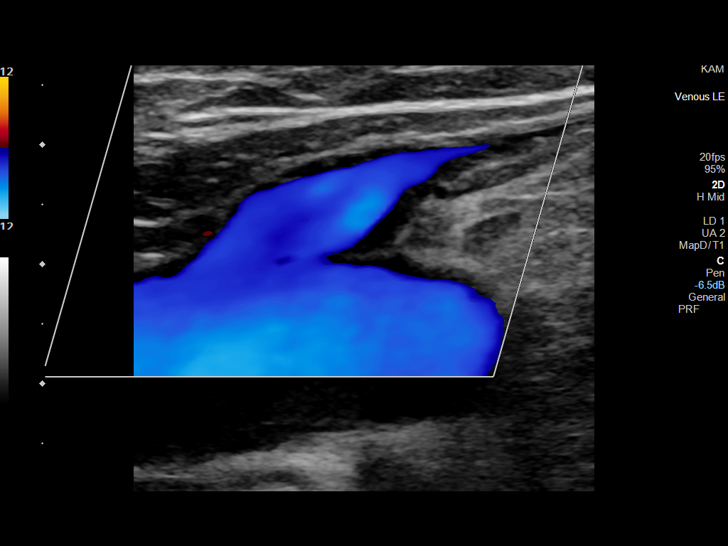
[im 17/66]
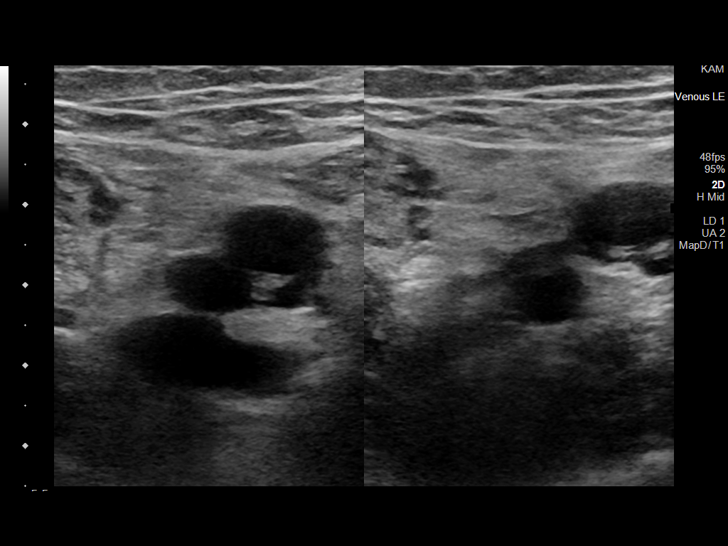
[im 23/66]
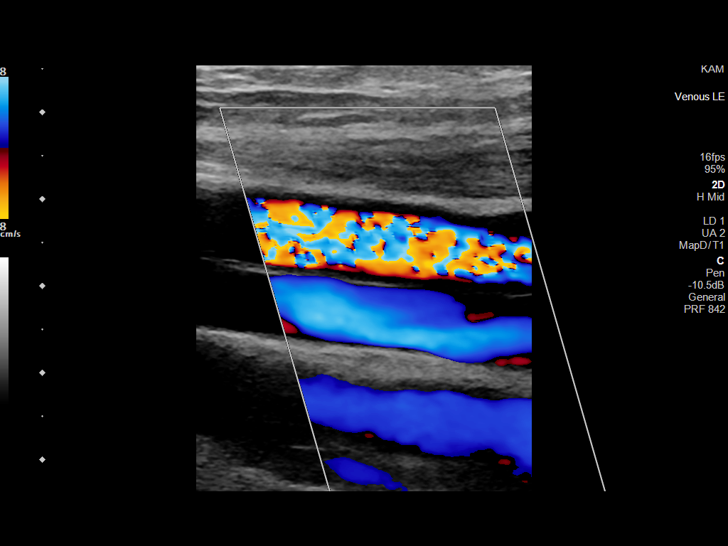
[im 29/66]
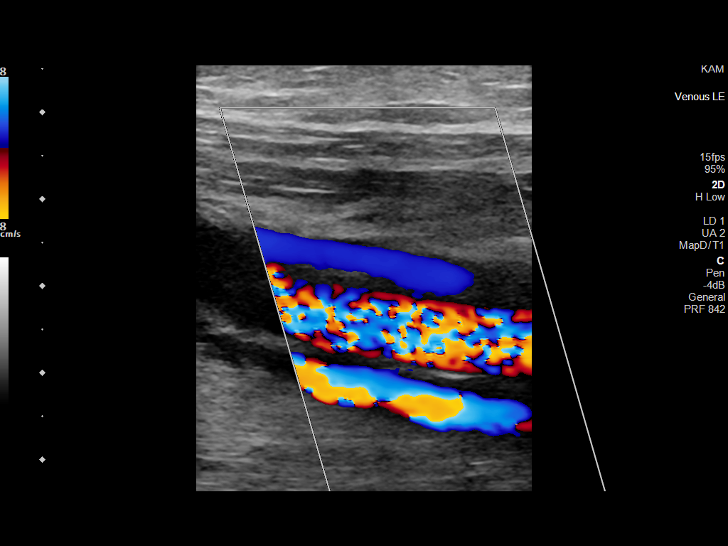
[im 34/66]
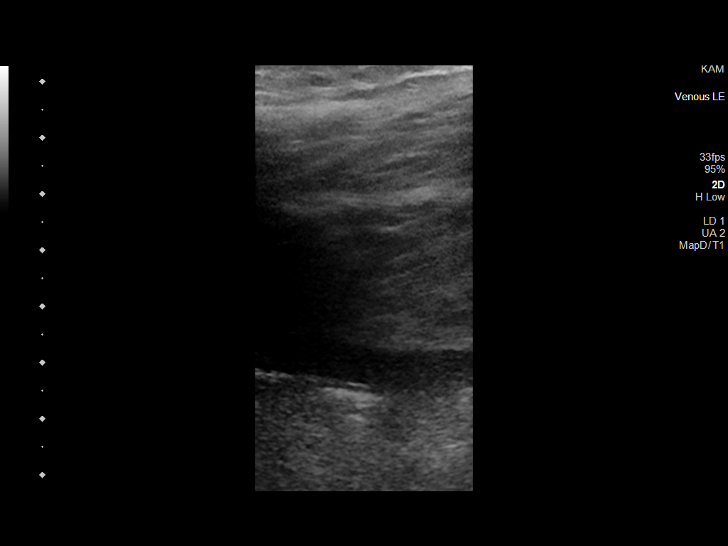
[im 37/66]
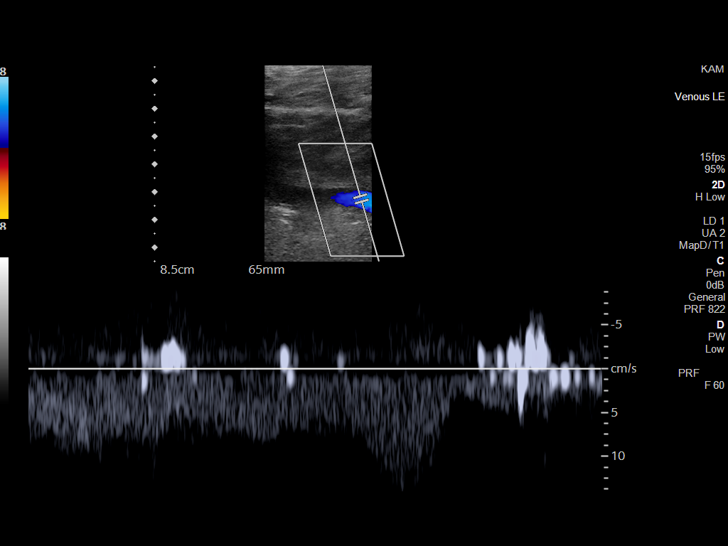
[im 43/66]
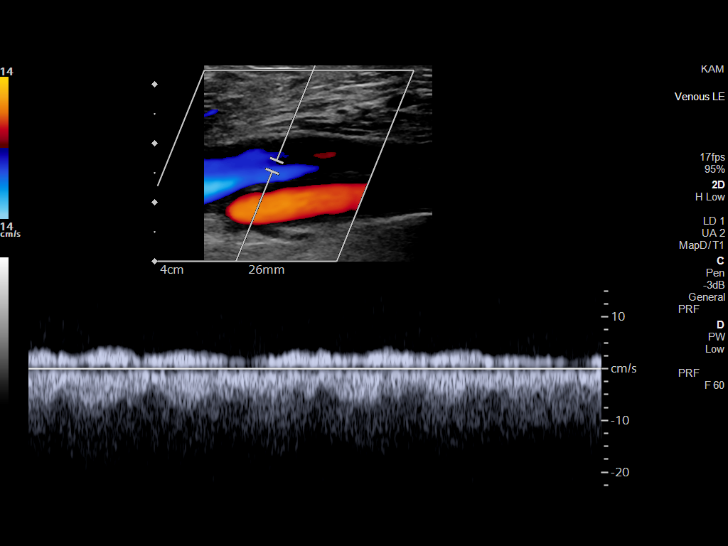
[im 49/66]
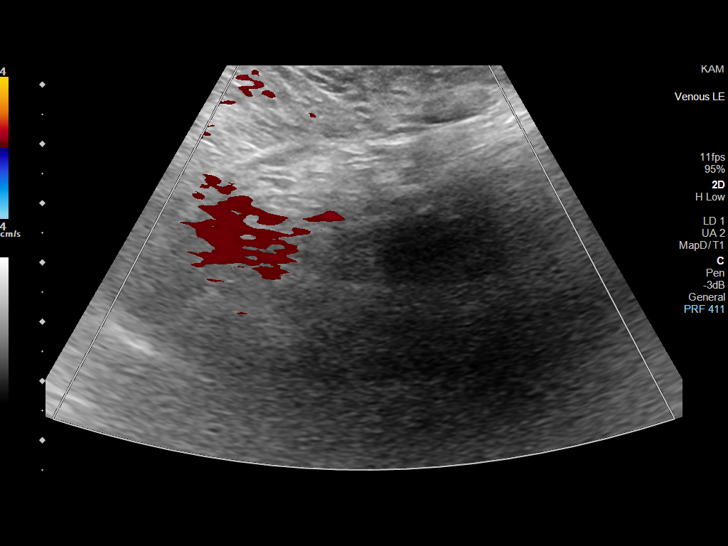
[im 54/66]
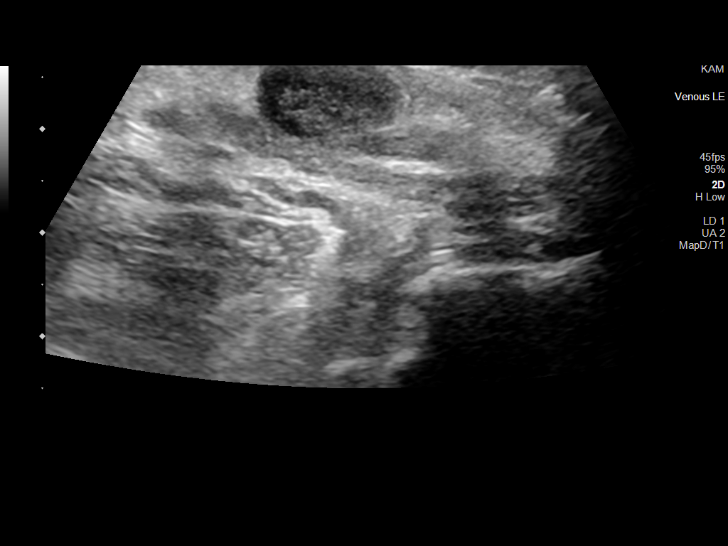
[im 60/66]
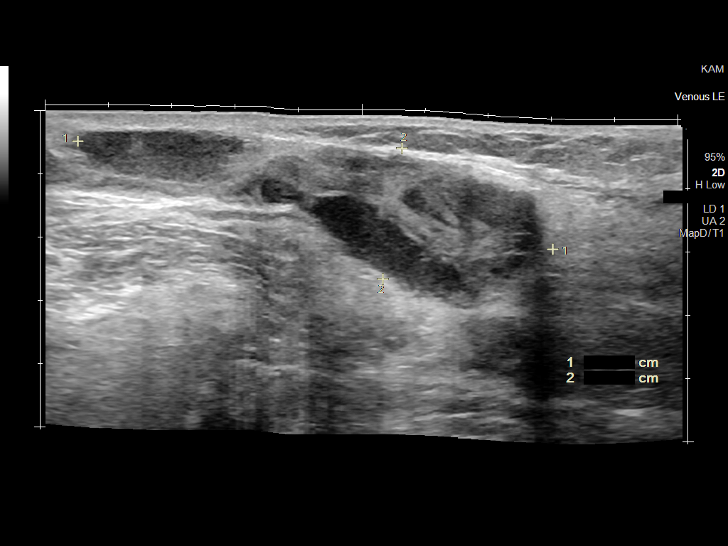
[im 66/66]
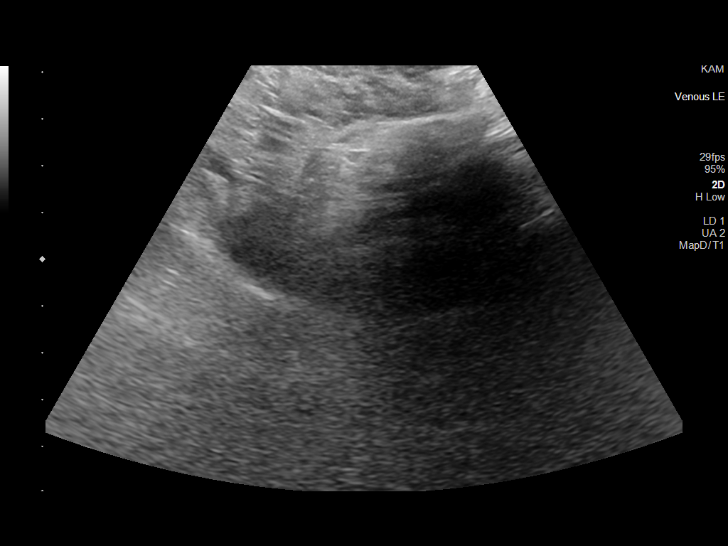

[13 of 24 positions shown; findings below may reference images not displayed]

FINDINGS: VENOUS

Normal compressibility of the common femoral, superficial femoral,
and popliteal veins, as well as the visualized calf veins.
Visualized portions of profunda femoral vein and great saphenous
vein unremarkable. No filling defects to suggest DVT on grayscale or
color Doppler imaging. Doppler waveforms show normal direction of
venous flow, normal respiratory plasticity and response to
augmentation.

Limited views of the contralateral common femoral vein are
unremarkable.

OTHER

In the medial left popliteal fossa, there is a complex fluid
collection measuring 7.6 x 2.1 x 1.5 cm. Heterogeneous echogenic
appearance. This is likely hematoma, possibly postoperative or
hemorrhagic popliteal cyst.

Limitations: none
IMPRESSION: 1. No evidence of significant deep venous thrombosis in the
visualized lower extremity veins.
2. Complex fluid collection, likely hematoma in the left popliteal
fossa.

## 2022-05-27 ENCOUNTER — Other Ambulatory Visit: Payer: Self-pay | Admitting: Behavioral Health

## 2022-05-27 DIAGNOSIS — F5105 Insomnia due to other mental disorder: Secondary | ICD-10-CM

## 2022-05-29 ENCOUNTER — Other Ambulatory Visit: Payer: Self-pay

## 2022-05-29 ENCOUNTER — Emergency Department (HOSPITAL_BASED_OUTPATIENT_CLINIC_OR_DEPARTMENT_OTHER): Payer: Medicare HMO

## 2022-05-29 ENCOUNTER — Encounter (HOSPITAL_BASED_OUTPATIENT_CLINIC_OR_DEPARTMENT_OTHER): Payer: Self-pay | Admitting: Pediatrics

## 2022-05-29 ENCOUNTER — Emergency Department (HOSPITAL_BASED_OUTPATIENT_CLINIC_OR_DEPARTMENT_OTHER)
Admission: EM | Admit: 2022-05-29 | Discharge: 2022-05-29 | Disposition: A | Discharge status: Home / Self Care | Payer: Medicare HMO | Attending: Emergency Medicine | Admitting: Emergency Medicine

## 2022-05-29 DIAGNOSIS — Z86718 Personal history of other venous thrombosis and embolism: Secondary | ICD-10-CM | POA: Diagnosis not present

## 2022-05-29 DIAGNOSIS — R000 Tachycardia, unspecified: Secondary | ICD-10-CM | POA: Diagnosis not present

## 2022-05-29 DIAGNOSIS — R0989 Other specified symptoms and signs involving the circulatory and respiratory systems: Secondary | ICD-10-CM | POA: Diagnosis not present

## 2022-05-29 DIAGNOSIS — Z1152 Encounter for screening for COVID-19: Secondary | ICD-10-CM | POA: Insufficient documentation

## 2022-05-29 DIAGNOSIS — Z79899 Other long term (current) drug therapy: Secondary | ICD-10-CM | POA: Insufficient documentation

## 2022-05-29 DIAGNOSIS — R110 Nausea: Secondary | ICD-10-CM | POA: Diagnosis not present

## 2022-05-29 DIAGNOSIS — Z86711 Personal history of pulmonary embolism: Secondary | ICD-10-CM | POA: Diagnosis not present

## 2022-05-29 DIAGNOSIS — R0789 Other chest pain: Secondary | ICD-10-CM | POA: Diagnosis not present

## 2022-05-29 DIAGNOSIS — R070 Pain in throat: Secondary | ICD-10-CM | POA: Diagnosis not present

## 2022-05-29 DIAGNOSIS — R09A2 Foreign body sensation, throat: Secondary | ICD-10-CM | POA: Insufficient documentation

## 2022-05-29 DIAGNOSIS — Z7901 Long term (current) use of anticoagulants: Secondary | ICD-10-CM | POA: Insufficient documentation

## 2022-05-29 DIAGNOSIS — K219 Gastro-esophageal reflux disease without esophagitis: Secondary | ICD-10-CM | POA: Diagnosis not present

## 2022-05-29 HISTORY — DX: Acute embolism and thrombosis of unspecified deep veins of unspecified lower extremity: I82.409

## 2022-05-29 LAB — BASIC METABOLIC PANEL
Anion gap: 10 (ref 5–15)
BUN: 14 mg/dL (ref 8–23)
CO2: 23 mmol/L (ref 22–32)
Calcium: 9.4 mg/dL (ref 8.9–10.3)
Chloride: 106 mmol/L (ref 98–111)
Creatinine, Ser: 0.89 mg/dL (ref 0.61–1.24)
GFR, Estimated: >60 mL/min (ref >60)
Glucose, Bld: 140 mg/dL — ABNORMAL HIGH (ref 70–99)
Potassium: 3.6 mmol/L (ref 3.5–5.1)
Sodium: 139 mmol/L (ref 135–145)

## 2022-05-29 LAB — RESP PANEL BY RT-PCR (RSV, FLU A&B, COVID)  RVPGX2
Influenza A by PCR: NEGATIVE
Influenza B by PCR: NEGATIVE
Resp Syncytial Virus by PCR: NEGATIVE
SARS Coronavirus 2 by RT PCR: NEGATIVE

## 2022-05-29 LAB — CBC
HCT: 43.4 % (ref 39.0–52.0)
Hemoglobin: 15.2 g/dL (ref 13.0–17.0)
MCH: 31.9 pg (ref 26.0–34.0)
MCHC: 35 g/dL (ref 30.0–36.0)
MCV: 91.2 fL (ref 80.0–100.0)
Platelets: 178 10*3/uL (ref 150–400)
RBC: 4.76 MIL/uL (ref 4.22–5.81)
RDW: 12.5 % (ref 11.5–15.5)
WBC: 5.6 10*3/uL (ref 4.0–10.5)
nRBC: 0 % (ref 0.0–0.2)

## 2022-05-29 LAB — TROPONIN I (HIGH SENSITIVITY)
Troponin I (High Sensitivity): 7 ng/L (ref <18)
Troponin I (High Sensitivity): 7 ng/L (ref <18)

## 2022-05-29 MED ORDER — ONDANSETRON 4 MG PO TBDP
4.0000 mg | ORAL_TABLET | Freq: Once | ORAL | Status: AC
  Administered 2022-05-29: 4 mg via ORAL
  Filled 2022-05-29: qty 1

## 2022-05-29 MED ORDER — FAMOTIDINE 20 MG PO TABS
20.0000 mg | ORAL_TABLET | Freq: Once | ORAL | Status: AC
  Administered 2022-05-29: 20 mg via ORAL
  Filled 2022-05-29: qty 1

## 2022-05-29 MED ORDER — LORAZEPAM 1 MG PO TABS
1.0000 mg | ORAL_TABLET | Freq: Once | ORAL | Status: AC
  Administered 2022-05-29: 1 mg via ORAL
  Filled 2022-05-29: qty 1

## 2022-05-29 NOTE — ED Provider Notes (Signed)
[facility] EMERGENCY DEPARTMENT Provider Note   CSN: [id number] Arrival date & time: 05/29/22  1602     History  Chief Complaint  Patient presents with   Chest Pain    Lawrence Carlson is a 67 y.o. male who presents emergency department with a chief complaint of throat discomfort and chest discomfort.  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  He is anticoagulated on apixaban.  He has secondary past medical history of mood disorder and depression with bipolar on Zyprexa and Ativan.  Patient states that he woke this morning with globus sensation in the throat, pain with swallowing.  This progressed into some discomfort and pressure in his chest and mild nausea without vomiting.  He denies chest pain, shortness of breath, diaphoresis, exertional dyspnea.  He denies fever or chills.  He has a history of gastroesophageal reflux and takes chronic acid reducing medications.  The history is provided by the patient and medical records.  Chest Pain      Home Medications Prior to Admission medications   Medication Sig Start Date End Date Taking? Authorizing Provider  ALPRAZolam Duanne Moron) 0.5 MG tablet Take 0.5 mg by mouth at bedtime. 08/31/20   [provider]  apixaban (ELIQUIS) 2.5 MG TABS tablet Take 1 tablet (2.5 mg total) by mouth 2 (two) times daily. 06/29/21 07/29/21  Swinteck, Aaron Edelman, MD  Cyanocobalamin (B-12 PO) Take 1 tablet by mouth daily.    [provider]  HYDROcodone-acetaminophen (NORCO) 5-325 MG tablet Take 1 tablet by mouth every 4 (four) hours as needed for moderate pain. 06/29/21   Swinteck, Aaron Edelman, MD  LORazepam (ATIVAN) 1 MG tablet Take 1 tablet (1 mg total) by mouth 3 (three) times daily as needed for anxiety. 03/14/22   Fredia Sorrow, MD  LORazepam (ATIVAN) 1 MG tablet TAKE ONE TABLET BY MOUTH EVERY 8 HOURS 05/15/22   White, Louanna Raw, NP  methocarbamol (ROBAXIN) 500 MG tablet Take 1 tablet (500 mg total) by mouth  every 6 (six) hours as needed for muscle spasms. 06/29/21   Swinteck, Aaron Edelman, MD  Multiple Vitamin (MULTIVITAMIN WITH MINERALS) TABS tablet Take 2 tablets by mouth daily.    [provider]  OLANZapine (ZYPREXA) 10 MG tablet TAKE ONE AND ONE-HALF TABLETS (15 MG TOTAL) BY MOUTH DAILY 04/11/22   White, Aaron Edelman A, NP  ondansetron (ZOFRAN) 4 MG tablet Take 1 tablet (4 mg total) by mouth every 8 (eight) hours as needed for nausea or vomiting. 06/29/21   Swinteck, Aaron Edelman, MD  traZODone (DESYREL) 100 MG tablet TAKE ONE AND ONE-HALF TABLETS BY MOUTH AT BEDTIME 04/11/22   Elwanda Brooklyn, NP  Vilazodone HCl 20 MG TABS Take 1 tablet (20 mg total) by mouth daily after breakfast. 04/11/22   Elwanda Brooklyn, NP      Allergies    Patient has no known allergies.    Review of Systems   Review of Systems  Cardiovascular:  Positive for chest pain.    Physical Exam Updated Vital Signs BP 135/81 (BP Location: Left Arm)   Pulse (!) 107   Temp 98.3 F (36.8 C) (Oral)   Resp 18   Ht 5\' 11"  (1.803 m)   Wt 83.9 kg   SpO2 96%   BMI 25.80 kg/m  Physical Exam Vitals and nursing note reviewed.  Constitutional:      General: He is not in acute distress.    Appearance: He is well-developed. He is  not diaphoretic.  HENT:     Head: Normocephalic and atraumatic.     Mouth/Throat:     Pharynx: Oropharynx is clear. Uvula midline. No pharyngeal swelling or posterior oropharyngeal erythema.     Tonsils: No tonsillar exudate.  Eyes:     General: No scleral icterus.    Conjunctiva/sclera: Conjunctivae normal.  Cardiovascular:     Rate and Rhythm: Normal rate and regular rhythm.     Heart sounds: Normal heart sounds.  Pulmonary:     Effort: Pulmonary effort is normal. No respiratory distress.     Breath sounds: Normal breath sounds.  Abdominal:     Palpations: Abdomen is soft.     Tenderness: There is no abdominal tenderness.  Musculoskeletal:     Cervical back: Normal range of motion and neck supple.   Skin:    General: Skin is warm and dry.  Neurological:     Mental Status: He is alert.  Psychiatric:        Behavior: Behavior normal.     ED Results / Procedures / Treatments   Labs (all labs ordered are listed, but only abnormal results are displayed) Labs Reviewed  BASIC METABOLIC PANEL - Abnormal; Notable for the following components:      Result Value   Glucose, Bld 140 (*)    All other components within normal limits  CBC  TROPONIN I (HIGH SENSITIVITY)  TROPONIN I (HIGH SENSITIVITY)    EKG None  Radiology DG Chest 2 View  Result Date: 05/29/2022 CLINICAL DATA:  Chest pain EXAM: CHEST - 2 VIEW COMPARISON:  03/14/2022 FINDINGS: Cardiac size is within normal limits. There are no signs of pulmonary edema or focal pulmonary consolidation. There is no pleural effusion or pneumothorax. Degenerative changes are noted in right shoulder and right AC joint. There is decreased distance between humeral head and acromion in right shoulder. IMPRESSION: No active cardiopulmonary disease. Electronically Signed   By: Ernie Avena M.D.   On: 05/29/2022 16:37    Procedures Procedures    Medications Ordered in ED Medications  famotidine (PEPCID) tablet 20 mg (has no administration in time range)  ondansetron (ZOFRAN-ODT) disintegrating tablet 4 mg (has no administration in time range)    Initial impression and plan: 67 year old male with history as stated above.  He has throat discomfort and chest pressure.  Differential diagnosis includes ACS, pneumonia.  More likely this is a GI specific issue.  Patient is mildly tachycardic.  I have added on a respiratory panel. Labs and imaging reviewed per ED course.  Will await second troponin.  ED Course/ Medical Decision Making/ A&P Clinical Course as of 05/29/22 1823  Mon May 29, 2022  1811 CBC [AH]  1811 Basic metabolic panel(!) [AH]  1811 Glucose(!): 140 [AH]  1811 Troponin I (High Sensitivity) Labs reviewed without significant  abnormality, no leukocytosis [AH]  1811 Initial EKG showed sinus tachycardia [AH]  1811 DG Chest 2 View [AH]  1811 Visualized and interpreted two-view chest x-ray which shows no acute findings [AH]  1823 Patient takes ativan at home. Request home dose  [AH]    Clinical Course User Index [AH] Arthor Captain, PA-C                           Medical Decision Making Given the large differential diagnosis for Lawrence Carlson, the decision making in this case is of high complexity.  After evaluating all of the data points in this case, the presentation  of Lawrence Carlson is NOT consistent with Acute Coronary Syndrome (ACS) and/or myocardial ischemia, pulmonary embolism, aortic dissection; Borhaave's, significant arrythmia, pneumothorax, cardiac tamponade, or other emergent cardiopulmonary condition.  Further, the presentation of Lawrence Carlson is NOT consistent with pericarditis, myocarditis, cholecystitis, pancreatitis, mediastinitis, endocarditis, new valvular disease.  Additionally, the presentation of Lawrence Carlson NOT consistent with flail chest, cardiac contusion, ARDS, or significant intra-thoracic or intra-abdominal bleeding.  Moreover, this presentation is NOT consistent with pneumonia, sepsis, or pyelonephritis.  The patient has a HEART Score: 2    Strict return and follow-up precautions have been given by me personally or by detailed written instruction given verbally by nursing staff using the teach back method to the patient/family/caregiver(s).  Data Reviewed/Counseling: I have reviewed the patient's vital signs, nursing notes, and other relevant tests/information. I had a detailed discussion regarding the historical points, exam findings, and any diagnostic results supporting the discharge diagnosis. I also discussed the need for outpatient follow-up and the need to return to the ED if symptoms worsen or if there are any questions or concerns that arise at home.    Amount and/or  Complexity of Data Reviewed Labs: ordered. Decision-making details documented in ED Course. Radiology: ordered and independent interpretation performed. Decision-making details documented in ED Course.    Details: Visualized and interpreted two-view chest x-ray no acute findings  Risk Prescription drug management.           Final Clinical Impression(s) / ED Diagnoses Final diagnoses:  None    Rx / DC Orders ED Discharge Orders     None         Margarita Mail, PA-C 05/29/22 2315    Lajean Saver, MD 06/02/22 [phone]

## 2022-05-29 NOTE — ED Notes (Signed)
Pt provided with snack and drink per pt request.

## 2022-05-29 NOTE — Discharge Instructions (Signed)

## 2022-05-29 NOTE — ED Triage Notes (Signed)
C/O chest pain, lightheadedness and sensation of chocking upon waking this morning; c/o abdominal discomfort this morning as well.

## 2022-06-07 ENCOUNTER — Ambulatory Visit: Payer: Medicare HMO | Admitting: Behavioral Health

## 2022-06-15 ENCOUNTER — Other Ambulatory Visit: Payer: Self-pay | Admitting: Behavioral Health

## 2022-06-15 DIAGNOSIS — F411 Generalized anxiety disorder: Secondary | ICD-10-CM

## 2022-06-15 DIAGNOSIS — F410 Panic disorder [episodic paroxysmal anxiety] without agoraphobia: Secondary | ICD-10-CM

## 2022-06-15 NOTE — Telephone Encounter (Signed)
Please call to schedule an appt  

## 2022-06-16 ENCOUNTER — Emergency Department (HOSPITAL_BASED_OUTPATIENT_CLINIC_OR_DEPARTMENT_OTHER)
Admission: EM | Admit: 2022-06-16 | Discharge: 2022-06-16 | Disposition: A | Discharge status: Home / Self Care | Payer: Medicare HMO | Attending: Emergency Medicine | Admitting: Emergency Medicine

## 2022-06-16 ENCOUNTER — Emergency Department (HOSPITAL_BASED_OUTPATIENT_CLINIC_OR_DEPARTMENT_OTHER): Payer: Medicare HMO

## 2022-06-16 ENCOUNTER — Other Ambulatory Visit: Payer: Self-pay

## 2022-06-16 DIAGNOSIS — F319 Bipolar disorder, unspecified: Secondary | ICD-10-CM | POA: Diagnosis not present

## 2022-06-16 DIAGNOSIS — R0789 Other chest pain: Secondary | ICD-10-CM | POA: Diagnosis present

## 2022-06-16 DIAGNOSIS — Z86711 Personal history of pulmonary embolism: Secondary | ICD-10-CM | POA: Insufficient documentation

## 2022-06-16 DIAGNOSIS — R7309 Other abnormal glucose: Secondary | ICD-10-CM | POA: Diagnosis not present

## 2022-06-16 DIAGNOSIS — Z7901 Long term (current) use of anticoagulants: Secondary | ICD-10-CM | POA: Insufficient documentation

## 2022-06-16 DIAGNOSIS — Z8709 Personal history of other diseases of the respiratory system: Secondary | ICD-10-CM

## 2022-06-16 DIAGNOSIS — Z79899 Other long term (current) drug therapy: Secondary | ICD-10-CM | POA: Insufficient documentation

## 2022-06-16 DIAGNOSIS — R110 Nausea: Secondary | ICD-10-CM | POA: Insufficient documentation

## 2022-06-16 DIAGNOSIS — Z8669 Personal history of other diseases of the nervous system and sense organs: Secondary | ICD-10-CM | POA: Diagnosis not present

## 2022-06-16 DIAGNOSIS — F419 Anxiety disorder, unspecified: Secondary | ICD-10-CM | POA: Insufficient documentation

## 2022-06-16 DIAGNOSIS — Z86718 Personal history of other venous thrombosis and embolism: Secondary | ICD-10-CM | POA: Insufficient documentation

## 2022-06-16 DIAGNOSIS — R079 Chest pain, unspecified: Secondary | ICD-10-CM

## 2022-06-16 DIAGNOSIS — F458 Other somatoform disorders: Secondary | ICD-10-CM | POA: Diagnosis not present

## 2022-06-16 DIAGNOSIS — R09A2 Foreign body sensation, throat: Secondary | ICD-10-CM

## 2022-06-16 LAB — BASIC METABOLIC PANEL
Anion gap: 9 (ref 5–15)
BUN: 15 mg/dL (ref 8–23)
CO2: 25 mmol/L (ref 22–32)
Calcium: 9.1 mg/dL (ref 8.9–10.3)
Chloride: 105 mmol/L (ref 98–111)
Creatinine, Ser: 1.07 mg/dL (ref 0.61–1.24)
GFR, Estimated: >60 mL/min (ref >60)
Glucose, Bld: 117 mg/dL — ABNORMAL HIGH (ref 70–99)
Potassium: 3.7 mmol/L (ref 3.5–5.1)
Sodium: 139 mmol/L (ref 135–145)

## 2022-06-16 LAB — CBC WITH DIFFERENTIAL/PLATELET
Abs Immature Granulocytes: 0.02 10*3/uL (ref 0.00–0.07)
Basophils Absolute: 0 10*3/uL (ref 0.0–0.1)
Basophils Relative: 1 %
Eosinophils Absolute: 0 10*3/uL (ref 0.0–0.5)
Eosinophils Relative: 1 %
HCT: 45.6 % (ref 39.0–52.0)
Hemoglobin: 15.5 g/dL (ref 13.0–17.0)
Immature Granulocytes: 0 %
Lymphocytes Relative: 15 %
Lymphs Abs: 0.9 10*3/uL (ref 0.7–4.0)
MCH: 31.6 pg (ref 26.0–34.0)
MCHC: 34 g/dL (ref 30.0–36.0)
MCV: 92.9 fL (ref 80.0–100.0)
Monocytes Absolute: 0.3 10*3/uL (ref 0.1–1.0)
Monocytes Relative: 5 %
Neutro Abs: 4.9 10*3/uL (ref 1.7–7.7)
Neutrophils Relative %: 78 %
Platelets: 205 10*3/uL (ref 150–400)
RBC: 4.91 MIL/uL (ref 4.22–5.81)
RDW: 12.6 % (ref 11.5–15.5)
WBC: 6.2 10*3/uL (ref 4.0–10.5)
nRBC: 0 % (ref 0.0–0.2)

## 2022-06-16 LAB — TROPONIN I (HIGH SENSITIVITY)
Troponin I (High Sensitivity): 3 ng/L
Troponin I (High Sensitivity): 4 ng/L (ref <18)

## 2022-06-16 MED ORDER — FAMOTIDINE 20 MG PO TABS
20.0000 mg | ORAL_TABLET | Freq: Once | ORAL | Status: AC
  Administered 2022-06-16: 20 mg via ORAL
  Filled 2022-06-16: qty 1

## 2022-06-16 MED ORDER — LORAZEPAM 1 MG PO TABS: 1.0000 mg | ORAL_TABLET | Freq: Four times a day (QID) | ORAL | 0 refills | Status: DC | PRN

## 2022-06-16 MED ORDER — LORAZEPAM 1 MG PO TABS
1.0000 mg | ORAL_TABLET | Freq: Once | ORAL | Status: AC
  Administered 2022-06-16: 1 mg via ORAL
  Filled 2022-06-16: qty 1

## 2022-06-16 NOTE — Telephone Encounter (Signed)
Filled 06/08/22 for 10 days

## 2022-06-16 NOTE — Discharge Instructions (Addendum)
You were evaluated in the emergency department today for sensation of lump in the throat and feelings of anxiousness and chest discomfort.  Your overall workup today was nonspecific.  Your symptoms improved with Ativan and Pepcid.  This may or may not have some relation to GERD or feelings of anxiousness.  Please keep your follow-up appointment with your mental health provider on Monday as planned.  A short course of Ativan has been sent to the pharmacy that you may utilize as normally prescribed. Also schedule a follow up appointment with your PCP within the next 3-4 days for re-evaluation and continued medical management.  Return to the ED for new or worsening symptoms as discussed.

## 2022-06-16 NOTE — ED Notes (Signed)
Feels light heading, dizzy at times per his statement, has taken Ativan PO before for same complaints and has felt better, will provide Ativan PO per ED MD orders, safety measures in place. SR x 2 up, call bell placed within reach.

## 2022-06-16 NOTE — Telephone Encounter (Signed)
Pt scheduled appt for 1/22.  He asked that med be sent in.

## 2022-06-16 NOTE — ED Notes (Signed)
Pt cont to complain of "something in my throat", no dysphonia or dysphagia is noted at this time, denies any chest pain, cont to note NSR on monitor, resting quietly, closing eyes intermittently. Pt states he does feel much better after Ativan PO

## 2022-06-16 NOTE — ED Provider Notes (Signed)
[facility] DE[facility] AT [facility] Provider Note   CSN: [id number] Arrival date & time: 06/16/22  1536     History  Chief Complaint  Patient presents with   Chest Pain    Bailey Kolbe is a 67 y.o. male with Hx of depression, DVT, prior PE presenting to the ED with chest pain, anxiousness, and a lump in his throat.  Seen in the ED recently 05/29/2022 for same and reports identical sensations/presentation.  States this workup was indeterminate.    States the lump sensation is still present, however without current lightheadedness or dizziness.  Rates anxiousness as 10/10 with some chest discomfort.  Also with some nausea, however no vomiting, abdominal pain, diarrhea, or back pain.  Denies shortness of breath, fevers, chills, though notes he recently recovered from the flu.  Denies numbness tingling or weakness of the lower extremities.  Denies headache, syncope, or vision changes.  He is anticoagulated on apixaban.  Noted history also includes bipolar disorder on Zyprexa and Ativan 3 times a day, panic attacks, and GERD.  The history is provided by the patient and medical records.  Chest Pain     Home Medications Prior to Admission medications   Medication Sig Start Date End Date Taking? Authorizing Provider  ALPRAZolam Prudy Feeler) 0.5 MG tablet Take 0.5 mg by mouth at bedtime. 08/31/20   [provider]  apixaban (ELIQUIS) 2.5 MG TABS tablet Take 1 tablet (2.5 mg total) by mouth 2 (two) times daily. 06/29/21 07/29/21  Swinteck, Arlys John, MD  Cyanocobalamin (B-12 PO) Take 1 tablet by mouth daily.    [provider]  HYDROcodone-acetaminophen (NORCO) 5-325 MG tablet Take 1 tablet by mouth every 4 (four) hours as needed for moderate pain. 06/29/21   Swinteck, Arlys John, MD  LORazepam (ATIVAN) 1 MG tablet Take 1 tablet (1 mg total) by mouth 3 (three) times daily as needed for anxiety. 03/14/22   Vanetta Mulders, MD  LORazepam (ATIVAN) 1 MG tablet TAKE ONE TABLET BY  MOUTH EVERY 8 HOURS 05/15/22   White, Arlys John A, NP  LORazepam (ATIVAN) 1 MG tablet Take 1 tablet (1 mg total) by mouth every 6 (six) hours as needed for anxiety. 06/16/22   Cecil Cobbs, PA-C  methocarbamol (ROBAXIN) 500 MG tablet Take 1 tablet (500 mg total) by mouth every 6 (six) hours as needed for muscle spasms. 06/29/21   Swinteck, Arlys John, MD  Multiple Vitamin (MULTIVITAMIN WITH MINERALS) TABS tablet Take 2 tablets by mouth daily.    [provider]  OLANZapine (ZYPREXA) 10 MG tablet TAKE ONE AND ONE-HALF TABLETS (15 MG TOTAL) BY MOUTH DAILY 04/11/22   White, Arlys John A, NP  ondansetron (ZOFRAN) 4 MG tablet Take 1 tablet (4 mg total) by mouth every 8 (eight) hours as needed for nausea or vomiting. 06/29/21   Swinteck, Arlys John, MD  traZODone (DESYREL) 100 MG tablet TAKE ONE AND ONE-HALF TABLETS BY MOUTH AT BEDTIME 05/30/22   Joan Flores, NP  Vilazodone HCl 20 MG TABS Take 1 tablet (20 mg total) by mouth daily after breakfast. 04/11/22   Joan Flores, NP      Allergies    Patient has no known allergies.    Review of Systems   Review of Systems  Cardiovascular:  Positive for chest pain.  Psychiatric/Behavioral:  The patient is nervous/anxious.     Physical Exam Updated Vital Signs BP (!) 143/92   Pulse 77   Temp 97.8 F (36.6 C)   Resp 14  SpO2 98%  Physical Exam Vitals and nursing note reviewed.  Constitutional:      General: He is not in acute distress.    Appearance: He is well-developed. He is not ill-appearing, toxic-appearing or diaphoretic.  HENT:     Head: Normocephalic and atraumatic.     Comments: Airway patent.  No angioedema.  Uvula midline without swelling.  No erythema of the posterior oropharynx.  No tonsillar swelling, erythema, abscess, or exudate appreciated.  Mildly inorganic observed slowed swallowing when directly asked to swallow, otherwise handling secretions and swallowing without difficulty.  No cervical lymphadenopathy.  No swelling or tenderness  of the throat/neck. Eyes:     General: Lids are normal. Gaze aligned appropriately.     Conjunctiva/sclera: Conjunctivae normal.  Cardiovascular:     Rate and Rhythm: Normal rate and regular rhythm.     Pulses:          Radial pulses are 2+ on the right side and 2+ on the left side.     Heart sounds: No murmur heard. Pulmonary:     Effort: Pulmonary effort is normal. No accessory muscle usage or respiratory distress.     Breath sounds: Normal breath sounds.     Comments: CTAB, able to communicate without difficulty, without increased respiratory effort.  Equal chest rise. Chest:     Chest wall: No mass, deformity, swelling, tenderness or crepitus.     Comments: Non-TTP. Abdominal:     Palpations: Abdomen is soft. There is no mass.     Tenderness: There is no abdominal tenderness. There is no guarding.     Comments: No pulsatile mass.  Musculoskeletal:        General: No swelling.     Cervical back: Neck supple.     Right lower leg: No edema.     Left lower leg: No edema.  Skin:    General: Skin is warm and dry.     Capillary Refill: Capillary refill takes less than 2 seconds.     Coloration: Skin is not cyanotic or pale.  Neurological:     Mental Status: He is alert and oriented to person, place, and time.     GCS: GCS eye subscore is 4. GCS verbal subscore is 5. GCS motor subscore is 6.     Cranial Nerves: No dysarthria or facial asymmetry.     Comments: AAOx4.  Follows simple and 2-step commands without difficulty.  No dysarthria.  No pronator drift. II: Visual fields are blink to threat.  PERRLA. III, IV, VI: EOMI to tracking examiner's face and to finger "H" test V: Facial sensation appears symmetrically intact VII: No facial asymmetry.  Facial movement appears equal and appropriate bilaterally VIII: Hearing intact to voice X: Uvula elevates symmetrically XI: Shoulder shrug is symmetric XII: Tongue is midline without atrophy or fasciculations.   Sensation reportedly  intact, without variation or abnormal distribution.  Finger-nose-finger and heel-shin intact bilaterally.  No truncal deviation.  Psychiatric:        Mood and Affect: Mood is anxious.    ED Results / Procedures / Treatments   Labs (all labs ordered are listed, but only abnormal results are displayed) Labs Reviewed  BASIC METABOLIC PANEL - Abnormal; Notable for the following components:      Result Value   Glucose, Bld 117 (*)    All other components within normal limits  CBC WITH DIFFERENTIAL/PLATELET  TROPONIN I (HIGH SENSITIVITY)  TROPONIN I (HIGH SENSITIVITY)    EKG EKG Interpretation  Date/Time:  Friday June 16 2022 15:49:32 EST Ventricular Rate:  94 PR Interval:  166 QRS Duration: 79 QT Interval:  352 QTC Calculation: 441 R Axis:   99 Text Interpretation: Sinus rhythm Right axis deviation Low voltage, extremity leads Interpretation limited secondary to artifact no stemi Confirmed by Tanda Rockers ([id number]) on 06/16/2022 6:19:13 PM  Radiology DG Chest 2 View  Result Date: 06/16/2022 CLINICAL DATA:  Chest pain EXAM: CHEST - 2 VIEW COMPARISON:  Chest x-ray May 29, 2022 FINDINGS: The cardiomediastinal silhouette is unchanged in contour. No focal pulmonary opacity. No pleural effusion or pneumothorax. The visualized upper abdomen is unremarkable. No acute osseous abnormality. IMPRESSION: No acute cardiopulmonary abnormality. Electronically Signed   By: Jacob Moores M.D.   On: 06/16/2022 16:10    Procedures Procedures    Medications Ordered in ED Medications  LORazepam (ATIVAN) tablet 1 mg (1 mg Oral Given 06/16/22 1716)  famotidine (PEPCID) tablet 20 mg (20 mg Oral Given 06/16/22 1835)    ED Course/ Medical Decision Making/ A&P                             Medical Decision Making Amount and/or Complexity of Data Reviewed Labs: ordered. Radiology: ordered.   67 y.o. male presents to the ED for concern of Chest Pain   This involves an extensive number of  treatment options, and is a complaint that carries with it a high risk of complications and morbidity.  The emergent differential diagnosis prior to evaluation includes, but is not limited to: ACS, anxiousness, GERD, myocarditis, PE, aortic dissection, pneumothorax, cardiac tamponade  This is not an exhaustive differential.   Past Medical History / Co-morbidities / Social History: Hx of depression, DVT, prior PE, bipolar disorder on Zyprexa and Ativan 3 times a day, panic attacks, and GERD Social Determinants of Health include: Geriatric  Additional History:  Obtained by chart review.  Notably previous ED visits which include recent 05/29/2022 for exact same presentation per patient.  Also reviewed psychiatry visit from 04/11/2022, which noted continued worsening anxiety and depression, poor sleep habits, and details multiple hospital visits for severe anxiety with history of severe mania the prior 2 years.  Also with noted hx of noncompliance.  Lab Tests: I ordered, and personally interpreted labs.  The pertinent results include:   Troponin 3, second pending Glucose 117, without electrolyte derangement or AKI.  No anion gap WBC 6.2, no leukocytosis or anemia  Imaging Studies: I ordered imaging studies including CXR.   I independently visualized and interpreted imaging which showed no acute cardiopulmonary pathology I agree with the radiologist interpretation.  ED Course: Pt well-appearing on exam.  Presenting with throat discomfort, chest pressure, and reported anxiousness 10/10.  Hemodynamically stable, not tachycardic or septic appearing, and is afebrile. HEART score of 2, low risk.  Considered ASA and nitroglycerin, however do not seem appropriate at this time.  PERC positive due to age and prior PE/DVT.  However Well's score 1.5 due to prior PE/DVT, though currently anticoagulated, low probability.  Labs unremarkable.  Unlikely pneumonia, no cough, no leukocytosis, no fevers, CXR and exam  without acute findings.  Unlikely pneumothorax, no findings on CXR.  Unlikely pericarditis/myocarditis, pancreatitis, flail chest, ARDS, or mediastinitis as does not fit clinical picture.  Chest pain not exertional.  No evidence of pleural effusion or pulmonary edema on CXR.  Unlikely dissection, no pulse deficit, no tearing chest pain, no neurologic complaints.  EKG findings indicate  without evidence of acute ischemic changes, abnormal intervals, or dysrhythmia.  No concerning change from prior.  Troponin 3, second troponin 4, unremarkable delta.  Plan to proceed with home dose of Ativan and Pepcid, and reevaluate.   Upon reevaluation, patient reports significant improvement with Ativan and Pepcid.  Shared decision making with patient.  Patient requesting a refill of home Ativan prescription.  PDMP reviewed, recent 30 tablets of Ativan, 10-day supply, filled on 06/08/2022.  Patient acknowledges this and admits to having taken more than the prescribed amount within the last few days due to increased feelings of anxiousness.  Has upcoming appointment this Monday with psychiatry to discuss this and request additional refill.  Denies SI/HI/AVH.  Does not appear to be responding to internal stimuli.  Does not appear manic, without pressured speech or bizarre behavior at this time.  Does not appear acutely psychotic or meet IVC criteria and I do not believe the patient presents an immediate risk to himself or others at this time. Overall, I am uncertain the exact etiology of the patient's symptoms.  However, I do not believe he is currently experiencing a medical, surgical, or psychiatric emergency.  Patient's clinical presentation is most consistent with GERD/PUD vs anxiousness.  With unremarkable delta troponin, plan to continue with psychiatry follow-up as planned and also recommend PCP follow up early next week.  Plan for short course, 5 tablets, of patient's home prescription for Ativan to reduce risk of possible  benzodiazepine withdrawal.  Strict return precautions discussed at length.  Patient reports satisfaction with today's encounter.  Patient in NAD and good condition at time of discharge.  Disposition: After consideration the patient's encounter today, I do not feel today's workup suggests an emergent condition requiring admission or immediate intervention beyond what has been performed at this time.  Safe for discharge; instructed to return immediately for worsening symptoms, change in symptoms or any other concerns.  I have reviewed the patients home medicines and have made adjustments as needed.  Discussed course of treatment with the patient, whom demonstrated understanding.  Patient in agreement and has no further questions.    I discussed this case with my attending physician Dr. Pearline Cables, who agreed with the proposed treatment course and cosigned this note.  Attending physician stated agreement with plan or made changes to plan which were implemented.    This chart was dictated using voice recognition software.  Despite best efforts to proofread, errors can occur which can change the documentation meaning.         Final Clinical Impression(s) / ED Diagnoses Final diagnoses:  Globus sensation  History of vocal cord paralysis  Nonspecific chest pain    Rx / DC Orders ED Discharge Orders          Ordered    LORazepam (ATIVAN) 1 MG tablet  Every 6 hours PRN,   Status:  Discontinued        06/16/22 1920    LORazepam (ATIVAN) 1 MG tablet  Every 6 hours PRN        06/16/22 1921              Prince Rome, PA-C 67/89/38 1926    Jeanell Sparrow, DO 06/17/22 2235

## 2022-06-16 NOTE — ED Notes (Signed)
2d Troponin obtained and to the lab

## 2022-06-16 NOTE — ED Triage Notes (Signed)
Chest pain and dizziness/light headedness that began this morning.  Seen for same 1/1 but states they did not find a reason at that time.  "Feel like I am choking"

## 2022-06-16 NOTE — Telephone Encounter (Signed)
Patient called in stating that his pharmacy doesn't have prescription for Lorazepam for 12/18. He would like prescription sent over ASAP. Pls call when this is done. Ph: [phone] Appt 1/22 Pharmacy [facility] [address] [place],

## 2022-06-19 ENCOUNTER — Ambulatory Visit: Payer: Medicare HMO | Admitting: Behavioral Health

## 2022-06-19 ENCOUNTER — Encounter: Payer: Self-pay | Admitting: Behavioral Health

## 2022-06-19 DIAGNOSIS — F411 Generalized anxiety disorder: Secondary | ICD-10-CM | POA: Diagnosis not present

## 2022-06-19 DIAGNOSIS — F3130 Bipolar disorder, current episode depressed, mild or moderate severity, unspecified: Secondary | ICD-10-CM | POA: Diagnosis not present
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
## 2022-06-29 ENCOUNTER — Other Ambulatory Visit: Payer: Self-pay | Admitting: Behavioral Health

## 2022-06-29 DIAGNOSIS — F5105 Insomnia due to other mental disorder: Secondary | ICD-10-CM

## 2022-07-04 ENCOUNTER — Ambulatory Visit (HOSPITAL_COMMUNITY)
Admission: EM | Admit: 2022-07-04 | Discharge: 2022-07-04 | Disposition: A | Discharge status: Home / Self Care | Payer: Medicare HMO | Attending: Psychiatry | Admitting: Psychiatry

## 2022-07-04 DIAGNOSIS — F419 Anxiety disorder, unspecified: Secondary | ICD-10-CM

## 2022-07-04 DIAGNOSIS — Z87820 Personal history of traumatic brain injury: Secondary | ICD-10-CM | POA: Insufficient documentation

## 2022-07-04 NOTE — Social Work (Signed)
The patient reports being very anxious cant sit down has to move. Patient reports feeling unsafe due to be anxious. The patient's daughter is here and is wanting someone to tell her how she can help her dad as he is being very manic. Patient denies SI, HI Denies drug use alcohol. Well Groomed.

## 2022-07-04 NOTE — Discharge Instructions (Addendum)
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
## 2022-07-11 ENCOUNTER — Other Ambulatory Visit: Payer: Self-pay

## 2022-07-11 ENCOUNTER — Emergency Department (HOSPITAL_BASED_OUTPATIENT_CLINIC_OR_DEPARTMENT_OTHER)
Admission: EM | Admit: 2022-07-11 | Discharge: 2022-07-11 | Discharge status: Against Medical Advice | Payer: Medicare HMO | Attending: Emergency Medicine | Admitting: Emergency Medicine

## 2022-07-11 DIAGNOSIS — R0989 Other specified symptoms and signs involving the circulatory and respiratory systems: Secondary | ICD-10-CM | POA: Insufficient documentation

## 2022-07-11 DIAGNOSIS — Z5329 Procedure and treatment not carried out because of patient's decision for other reasons: Secondary | ICD-10-CM | POA: Insufficient documentation

## 2022-07-11 DIAGNOSIS — Z7901 Long term (current) use of anticoagulants: Secondary | ICD-10-CM | POA: Insufficient documentation

## 2022-07-11 DIAGNOSIS — R42 Dizziness and giddiness: Secondary | ICD-10-CM | POA: Insufficient documentation

## 2022-07-11 LAB — CBC
HCT: 45.7 % (ref 39.0–52.0)
Hemoglobin: 16.1 g/dL (ref 13.0–17.0)
MCH: 31.8 pg (ref 26.0–34.0)
MCHC: 35.2 g/dL (ref 30.0–36.0)
MCV: 90.1 fL (ref 80.0–100.0)
Platelets: 194 10*3/uL (ref 150–400)
RBC: 5.07 MIL/uL (ref 4.22–5.81)
RDW: 12.5 % (ref 11.5–15.5)
WBC: 5.8 10*3/uL (ref 4.0–10.5)
nRBC: 0 % (ref 0.0–0.2)

## 2022-07-11 LAB — BASIC METABOLIC PANEL
Anion gap: 7 (ref 5–15)
BUN: 16 mg/dL (ref 8–23)
CO2: 22 mmol/L (ref 22–32)
Calcium: 9 mg/dL (ref 8.9–10.3)
Chloride: 109 mmol/L (ref 98–111)
Creatinine, Ser: 0.99 mg/dL (ref 0.61–1.24)
GFR, Estimated: >60 mL/min (ref >60)
Glucose, Bld: 114 mg/dL — ABNORMAL HIGH (ref 70–99)
Potassium: 3.6 mmol/L (ref 3.5–5.1)
Sodium: 138 mmol/L (ref 135–145)

## 2022-07-11 LAB — TROPONIN I (HIGH SENSITIVITY)
Troponin I (High Sensitivity): 4 ng/L (ref <18)
Troponin I (High Sensitivity): 4 ng/L (ref <18)

## 2022-07-11 NOTE — ED Provider Notes (Addendum)
[facility] Provider Note   CSN: [id number] Arrival date & time: 07/11/22  1416     History  Chief Complaint  Patient presents with   Dizziness   Choking    Lawrence Carlson is a 67 y.o. male.  Patient presenting with feeling like he been choking for couple hours denies any sore throat no swelling noted to his airway.  Patient reported lightheaded as well.  Patient states this happened before and he was given Ativan with some improvement in symptoms.  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  Patient is is on Eliquis.  Patient is never used tobacco products.  Patient was seen February 6 for an anxiety disorder at urgent care.  Patient seen January 19 [facility] for globus sensation.       Home Medications Prior to Admission medications   Medication Sig Start Date End Date Taking? Authorizing Provider  ALPRAZolam Duanne Moron) 0.5 MG tablet Take 0.5 mg by mouth at bedtime. 08/31/20   [provider]  apixaban (ELIQUIS) 2.5 MG TABS tablet Take 1 tablet (2.5 mg total) by mouth 2 (two) times daily. 06/29/21 07/29/21  Swinteck, Aaron Edelman, MD  Cyanocobalamin (B-12 PO) Take 1 tablet by mouth daily.    [provider]  HYDROcodone-acetaminophen (NORCO) 5-325 MG tablet Take 1 tablet by mouth every 4 (four) hours as needed for moderate pain. 06/29/21   Swinteck, Aaron Edelman, MD  LORazepam (ATIVAN) 1 MG tablet TAKE ONE TABLET BY MOUTH EVERY 8 HOURS 06/19/22   White, Louanna Raw, NP  methocarbamol (ROBAXIN) 500 MG tablet Take 1 tablet (500 mg total) by mouth every 6 (six) hours as needed for muscle spasms. 06/29/21   Swinteck, Aaron Edelman, MD  Multiple Vitamin (MULTIVITAMIN WITH MINERALS) TABS tablet Take 2 tablets by mouth daily.    [provider]  OLANZapine (ZYPREXA) 10 MG tablet TAKE ONE AND ONE-HALF TABLETS (15 MG TOTAL) BY MOUTH DAILY 04/11/22    White, Aaron Edelman A, NP  ondansetron (ZOFRAN) 4 MG tablet Take 1 tablet (4 mg total) by mouth every 8 (eight) hours as needed for nausea or vomiting. 06/29/21   Swinteck, Aaron Edelman, MD  traZODone (DESYREL) 100 MG tablet TAKE ONE AND ONE-HALF TABLETS BY MOUTH AT BEDTIME 06/30/22   Elwanda Brooklyn, NP  Vilazodone HCl 20 MG TABS Take 1 tablet (20 mg total) by mouth daily after breakfast. 04/11/22   Elwanda Brooklyn, NP      Allergies    Patient has no known allergies.    Review of Systems   Review of Systems  Constitutional:  Negative for chills and fever.  HENT:  Positive for trouble swallowing. Negative for ear pain and sore throat.   Eyes:  Negative for pain and visual disturbance.  Respiratory:  Negative for cough and shortness of breath.   Cardiovascular:  Negative for chest pain and palpitations.  Gastrointestinal:  Negative for abdominal pain and vomiting.  Genitourinary:  Negative for dysuria and hematuria.  Musculoskeletal:  Negative for arthralgias and back pain.  Skin:  Negative for color change and rash.  Neurological:  Positive for light-headedness. Negative for seizures and syncope.  All other systems reviewed and are negative.   Physical Exam Updated Vital Signs BP 137/82   Pulse 80   Temp 98.4 F (36.9 C)   Resp 12   Ht 1.803 m (5' 11"$ )   Wt 86.2 kg  SpO2 100%   BMI 26.50 kg/m  Physical Exam Vitals and nursing note reviewed.  Constitutional:      General: He is not in acute distress.    Appearance: He is well-developed.  HENT:     Head: Normocephalic and atraumatic.  Eyes:     Conjunctiva/sclera: Conjunctivae normal.  Cardiovascular:     Rate and Rhythm: Normal rate and regular rhythm.     Heart sounds: No murmur heard. Pulmonary:     Effort: Pulmonary effort is normal. No respiratory distress.     Breath sounds: Normal breath sounds.  Abdominal:     Palpations: Abdomen is soft.     Tenderness: There is no abdominal tenderness.  Musculoskeletal:        General: No  swelling.     Cervical back: Neck supple.  Skin:    General: Skin is warm and dry.     Capillary Refill: Capillary refill takes less than 2 seconds.  Neurological:     Mental Status: He is alert.  Psychiatric:        Mood and Affect: Mood normal.     ED Results / Procedures / Treatments   Labs (all labs ordered are listed, but only abnormal results are displayed) Labs Reviewed  BASIC METABOLIC PANEL - Abnormal; Notable for the following components:      Result Value   Glucose, Bld 114 (*)    All other components within normal limits  CBC  TROPONIN I (HIGH SENSITIVITY)  TROPONIN I (HIGH SENSITIVITY)    EKG None  Radiology No results found.  Procedures Procedures    Medications Ordered in ED Medications - No data to display  ED Course/ Medical Decision Making/ A&P                             Medical Decision Making Amount and/or Complexity of Data Reviewed Labs: ordered.   Patient's vital signs oxygen saturation 96%.  Respirations 20 blood pressure 127/97 heart rate 97 temp 98.4.  Patient in no acute distress.  Labs were ordered.  With patient left prior to completion of treatment and exam.  Results for orders placed or performed during the hospital encounter of 07/11/22  CBC  Result Value Ref Range   WBC 5.8 4.0 - 10.5 K/uL   RBC 5.07 4.22 - 5.81 MIL/uL   Hemoglobin 16.1 13.0 - 17.0 g/dL   HCT 45.7 39.0 - 52.0 %   MCV 90.1 80.0 - 100.0 fL   MCH 31.8 26.0 - 34.0 pg   MCHC 35.2 30.0 - 36.0 g/dL   RDW 12.5 11.5 - 15.5 %   Platelets 194 150 - 400 K/uL   nRBC 0.0 0.0 - 0.2 %  Basic metabolic panel  Result Value Ref Range   Sodium 138 135 - 145 mmol/L   Potassium 3.6 3.5 - 5.1 mmol/L   Chloride 109 98 - 111 mmol/L   CO2 22 22 - 32 mmol/L   Glucose, Bld 114 (H) 70 - 99 mg/dL   BUN 16 8 - 23 mg/dL   Creatinine, Ser 0.99 0.61 - 1.24 mg/dL   Calcium 9.0 8.9 - 10.3 mg/dL   GFR, Estimated >60 >60 mL/min   Anion gap 7 5 - 15  Troponin I (High Sensitivity)   Result Value Ref Range   Troponin I (High Sensitivity) 4 <18 ng/L  Troponin I (High Sensitivity)  Result Value Ref Range   Troponin I (High Sensitivity)  4 <18 ng/L   DG Chest 2 View  Result Date: 06/16/2022 CLINICAL DATA:  Chest pain EXAM: CHEST - 2 VIEW COMPARISON:  Chest x-ray May 29, 2022 FINDINGS: The cardiomediastinal silhouette is unchanged in contour. No focal pulmonary opacity. No pleural effusion or pneumothorax. The visualized upper abdomen is unremarkable. No acute osseous abnormality. IMPRESSION: No acute cardiopulmonary abnormality. Electronically Signed   By: Beryle Flock M.D.   On: 06/16/2022 16:10     Final Clinical Impression(s) / ED Diagnoses Final diagnoses:  Choking sensation    Rx / DC Orders ED Discharge Orders     None         Fredia Sorrow, MD 07/11/22 2040    Fredia Sorrow, MD 07/11/22 2041

## 2022-07-11 NOTE — ED Triage Notes (Signed)
Pt reports feeling like he is choking for the last couple hours, pt denies sore throat, and no swelling noted to airway. Pt reports feeling light headed as well. Pt reports this has happened before and he was given Ativan with some improvement in symptoms.

## 2022-07-20 ENCOUNTER — Other Ambulatory Visit: Payer: Self-pay | Admitting: Behavioral Health

## 2022-07-20 DIAGNOSIS — F411 Generalized anxiety disorder: Secondary | ICD-10-CM

## 2022-07-20 DIAGNOSIS — F410 Panic disorder [episodic paroxysmal anxiety] without agoraphobia: Secondary | ICD-10-CM

## 2022-07-21 NOTE — Telephone Encounter (Signed)
Pt called at 9:21a to request refill of Lorazepam.  He said he only gets 10 days a time, though script shows differently  [facility] [address] - [place], [place] - [address] [place] [id number] [address], [place] [address] Phone: [phone]  Fax: [phone]   No upcoming appts scheduled

## 2024-02-04 ENCOUNTER — Telehealth: Payer: Self-pay

## 2024-02-04 ENCOUNTER — Other Ambulatory Visit: Payer: Self-pay

## 2024-02-04 ENCOUNTER — Ambulatory Visit: Payer: Medicare (Managed Care) | Admitting: Internal Medicine

## 2024-02-04 ENCOUNTER — Encounter: Payer: Self-pay | Admitting: Internal Medicine

## 2024-02-04 VITALS — BP 126/84 | HR 93 | Temp 98.0°F | Ht 71.0 in | Wt 230.4 lb

## 2024-02-04 DIAGNOSIS — K30 Functional dyspepsia: Secondary | ICD-10-CM

## 2024-02-04 DIAGNOSIS — R110 Nausea: Secondary | ICD-10-CM

## 2024-02-04 DIAGNOSIS — R1013 Epigastric pain: Secondary | ICD-10-CM

## 2024-02-04 DIAGNOSIS — F5101 Primary insomnia: Secondary | ICD-10-CM

## 2024-02-04 LAB — COMPREHENSIVE METABOLIC PANEL WITH GFR
ALT: 34 U/L (ref 0–53)
AST: 22 U/L (ref 0–37)
Albumin: 4.2 g/dL (ref 3.5–5.2)
Alkaline Phosphatase: 92 U/L (ref 39–117)
BUN: 19 mg/dL (ref 6–23)
CO2: 28 meq/L (ref 19–32)
Calcium: 10 mg/dL (ref 8.4–10.5)
Chloride: 106 meq/L (ref 96–112)
Creatinine, Ser: 1.15 mg/dL (ref 0.40–1.50)
GFR: 65.16 mL/min (ref >60.00)
Glucose, Bld: 115 mg/dL — ABNORMAL HIGH (ref 70–99)
Potassium: 4.5 meq/L (ref 3.5–5.1)
Sodium: 141 meq/L (ref 135–145)
Total Bilirubin: 0.5 mg/dL (ref 0.2–1.2)
Total Protein: 7.4 g/dL (ref 6.0–8.3)

## 2024-02-04 LAB — AMYLASE: Amylase: 43 U/L (ref 27–131)

## 2024-02-04 LAB — CBC WITH DIFFERENTIAL/PLATELET
Basophils Absolute: 0 K/uL (ref 0.0–0.1)
Basophils Relative: 0.7 % (ref 0.0–3.0)
Eosinophils Absolute: 0.1 K/uL (ref 0.0–0.7)
Eosinophils Relative: 1.8 % (ref 0.0–5.0)
HCT: 49.6 % (ref 39.0–52.0)
Hemoglobin: 16.7 g/dL (ref 13.0–17.0)
Lymphocytes Relative: 26.7 % (ref 12.0–46.0)
Lymphs Abs: 1.1 K/uL (ref 0.7–4.0)
MCHC: 33.6 g/dL (ref 30.0–36.0)
MCV: 94.5 fl (ref 78.0–100.0)
Monocytes Absolute: 0.4 K/uL (ref 0.1–1.0)
Monocytes Relative: 9.9 % (ref 3.0–12.0)
Neutro Abs: 2.5 K/uL (ref 1.4–7.7)
Neutrophils Relative %: 60.9 % (ref 43.0–77.0)
Platelets: 171 K/uL (ref 150.0–400.0)
RBC: 5.25 Mil/uL (ref 4.22–5.81)
RDW: 13.9 % (ref 11.5–15.5)
WBC: 4 K/uL (ref 4.0–10.5)

## 2024-02-04 LAB — LIPASE: Lipase: 16 U/L (ref 11.0–59.0)

## 2024-02-04 MED ORDER — ESOMEPRAZOLE MAGNESIUM 40 MG PO CPDR: 40.0000 mg | DELAYED_RELEASE_CAPSULE | Freq: Every day | ORAL | 3 refills | Status: AC

## 2024-02-04 MED ORDER — ESOMEPRAZOLE MAGNESIUM 40 MG PO CPDR: 40.0000 mg | DELAYED_RELEASE_CAPSULE | Freq: Every day | ORAL | 3 refills | Status: DC

## 2024-02-04 MED ORDER — MAALOX MAX 400-400-40 MG/5ML PO SUSP: 10.0000 mL | Freq: Four times a day (QID) | ORAL | 0 refills | Status: AC | PRN

## 2024-02-04 NOTE — Telephone Encounter (Signed)
 Copied from CRM [id number]. Topic: Clinical - Prescription Issue >> Feb 04, 2024  3:11 PM Burnard DEL wrote: Reason for CRM: Patient was supposed to have nexium  prescribed for him at his visit today.He checked with pharmacy however they do not have the prescription.   [facility] [address] - [place], [place] - [address]  Phone: [phone] Fax: [phone]  [facility] review and advise looks like it was attach to [facility] pharmacy I do not know how and to resent to [facility].

## 2024-02-04 NOTE — Progress Notes (Addendum)
 [facility]  Phone: [phone]  - Medical Office Visit -  Visit Date: 02/04/2024 Patient: Lawrence Carlson   DOB: Feb 19, 1956   68 y.o. Male  MRN: [id number] Patient Care Team: Jesus Bernardino MATSU, MD as PCP - General (Internal Medicine) Today's Health Care Provider: Bernardino MATSU Jesus, MD  ===========================================    Chief Complaint / Reason for Visit: New Pt (Pt is present to est care with pcp) and Abdominal Pain (Pain wakes up with stomach pains and going on for years now. It is every day now even if he eats hurts. Stomach ache all the time comes and goes.  Has done been to GI doctor and all can not find out what's going. )   Background: 68 y.o. male who has Anticoagulated on Coumadin; Anxiety; Chronic gout; DJD (degenerative joint disease) of cervical spine; Dysphonia; Enlarged heart; Hx pulmonary embolism; Hyperlipidemia; Insomnia; Lung nodule; Paralysis of vocal cords and larynx, unilateral; and Osteoarthritis of left knee on their problem list.  Discussed the use of AI scribe software for clinical note transcription with the patient, who gave verbal consent to proceed.  History of Present Illness  68 year old male who presents with chronic abdominal pain.  He has been experiencing chronic abdominal pain for the past two months, which is particularly severe upon waking in the morning and typically subsides by lunchtime or later in the day. The pain is described as a stomach ache that causes significant discomfort, making him 'dread going anywhere'. Despite the pain, he is able to eat all his meals, including breakfast, without exacerbation of symptoms. He experiences gas with every meal, manifesting as burping or passing gas. No current nausea, but he experienced it for two months in the past.  He denies lactose intolerance, as he consumes cereal with milk and yogurt without issues. He does not consume alcohol , NSAIDs, or steroids, and reports no  significant stress currently.  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 He is currently 44 pounds overweight. He has recently started engaging in some physical activity, including walking and occasional workouts.  He has undergone extensive gastrointestinal evaluations, including an upper GI series and endoscopy about a year ago. He is currently taking pantoprazole 40 mg, although he admits to inconsistent use. He has not been taking any other medications like ibuprofen or Aleve.   Medications updated/reviewed: Current Outpatient Medications on File Prior to Visit  Medication Sig   atorvastatin (LIPITOR) 10 MG tablet Take 10 mg by mouth daily.   LORazepam  (ATIVAN ) 1 MG tablet TAKE ONE TABLET BY MOUTH EVERY 8 HOURS   QUEtiapine (SEROQUEL) 100 MG tablet Take 100 mg by mouth 2 (two) times daily.   traZODone  (DESYREL ) 100 MG tablet TAKE ONE AND ONE-HALF TABLETS BY MOUTH AT BEDTIME   Cyanocobalamin  (B-12 PO) Take 1 tablet by mouth daily.   OLANZapine  (ZYPREXA ) 10 MG tablet TAKE ONE AND ONE-HALF TABLETS (15 MG TOTAL) BY MOUTH DAILY   ondansetron  (ZOFRAN ) 4 MG tablet Take 1 tablet (4 mg total) by mouth every 8 (eight) hours as needed for nausea or vomiting.   Vilazodone  HCl 20 MG TABS Take 1 tablet (20 mg total) by mouth daily after breakfast.   No current facility-administered medications on file prior to visit.   Medications Discontinued During This Encounter  Medication Reason   Multiple Vitamin (MULTIVITAMIN WITH MINERALS) TABS tablet Completed Course  ALPRAZolam  (XANAX ) 0.5 MG tablet Completed Course   HYDROcodone -acetaminophen  (NORCO) 5-325 MG tablet Completed Course   apixaban  (ELIQUIS ) 2.5 MG TABS tablet Completed Course   methocarbamol  (ROBAXIN ) 500 MG tablet Completed Course   esomeprazole  (NEXIUM ) 40 MG capsule    Current Meds  Medication Sig   alum & mag  hydroxide-simeth (MAALOX MAX) 400-400-40 MG/5ML suspension Take 10 mLs by mouth every 6 (six) hours as needed for indigestion.   atorvastatin (LIPITOR) 10 MG tablet Take 10 mg by mouth daily.   esomeprazole  (NEXIUM ) 40 MG capsule Take 1 capsule (40 mg total) by mouth daily.   LORazepam  (ATIVAN ) 1 MG tablet TAKE ONE TABLET BY MOUTH EVERY 8 HOURS   QUEtiapine (SEROQUEL) 100 MG tablet Take 100 mg by mouth 2 (two) times daily.   traZODone  (DESYREL ) 100 MG tablet TAKE ONE AND ONE-HALF TABLETS BY MOUTH AT BEDTIME   [DISCONTINUED] esomeprazole  (NEXIUM ) 40 MG capsule Take 1 capsule (40 mg total) by mouth daily. Replaces pantoprazole    Allergies:  Patient has no known allergies. 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 Past Surgical History:   has a past surgical history that includes Throat surgery; Appendectomy; and Knee Arthroplasty (Left, 06/29/2021). Social History:   reports that he has never smoked. He has never used smokeless tobacco. He reports that he does not drink alcohol  and does not use drugs. Family History:  family history includes Alcohol  abuse in his father and mother. Depression Screen and Health Maintenance:    02/04/2024   10:53 AM  PHQ 2/9 Scores  PHQ - 2 Score 0   Health Maintenance  Topic Date Due   Medicare Annual Wellness (AWV)  Never done   COVID-19 Vaccine (1) Never done   Hepatitis C Screening  Never done   DTaP/Tdap/Td (1 - Tdap) Never done   Zoster Vaccines- Shingrix (1 of 2) Never done   Colonoscopy  Never done   Pneumococcal Vaccine: 50+ Years (2 of 2 - PCV) 02/18/2021   Influenza Vaccine  12/28/2023   HPV VACCINES  Aged Out   Meningococcal B Vaccine  Aged Out   Immunization History  Administered Date(s)  Administered   Influenza-Unspecified 05/05/2009, 03/15/2012, 07/11/2016, 02/06/2017, 02/27/2018     Objective   Physical ExamBP 126/84   Pulse 93   Temp 98 F (36.7 C) (Temporal)   Ht 5' 11 (1.803 m)   Wt 230 lb 6.4 oz (104.5 kg)   SpO2 98%   BMI 32.13 kg/m  Wt Readings from Last 10 Encounters:  02/04/24 230 lb 6.4 oz (104.5 kg)  07/11/22 190 lb (86.2 kg)  05/29/22 184 lb 15.5 oz (83.9 kg)  05/15/22 184 lb 15.5 oz (83.9 kg)  04/06/22 185 lb (83.9 kg)  03/14/22 180 lb (81.6 kg)  02/09/22 180 lb (81.6 kg)  11/08/21 181 lb (82.1 kg)  11/06/21 181 lb (82.1 kg)  07/02/21 186 lb (84.4 kg)  Vital signs reviewed.  Nursing notes reviewed. Weight trend reviewed. General Appearance:  Well developed, well nourished, well-groomed, healthy-appearing male with Body mass index is 32.13 kg/m. No acute distress appreciable.   Skin: Clear and well-hydrated. Pulmonary:  Normal work of breathing at rest, no respiratory distress apparent. SpO2: 98 %  Musculoskeletal: He demonstrates smooth and coordinated movements throughout all major joints.All extremities are  intact.  Neurological:  Awake, alert, oriented, and engaged.  No obvious focal neurological deficits or cognitive impairments.  Sensorium seems unclouded.  Psychiatric:  Appropriate mood, pleasant and cooperative demeanor, cheerful and engaged during the exam  Reviewed Results & Data Results     Results for orders placed or performed in visit on 02/04/24  CBC with Differential/Platelet  Result Value Ref Range   WBC 4.0 4.0 - 10.5 K/uL   RBC 5.25 4.22 - 5.81 Mil/uL   Hemoglobin 16.7 13.0 - 17.0 g/dL   HCT 50.3 60.9 - 47.9 %   MCV 94.5 78.0 - 100.0 fl   MCHC 33.6 30.0 - 36.0 g/dL   RDW 86.0 88.4 - 84.4 %   Platelets 171.0 150.0 - 400.0 K/uL   Neutrophils Relative % 60.9 43.0 - 77.0 %   Lymphocytes Relative 26.7 12.0 - 46.0 %   Monocytes Relative 9.9 3.0 - 12.0 %   Eosinophils Relative 1.8 0.0 - 5.0 %   Basophils Relative 0.7  0.0 - 3.0 %   Neutro Abs 2.5 1.4 - 7.7 K/uL   Lymphs Abs 1.1 0.7 - 4.0 K/uL   Monocytes Absolute 0.4 0.1 - 1.0 K/uL   Eosinophils Absolute 0.1 0.0 - 0.7 K/uL   Basophils Absolute 0.0 0.0 - 0.1 K/uL  Comp Met (CMET)  Result Value Ref Range   Sodium 141 135 - 145 mEq/L   Potassium 4.5 3.5 - 5.1 mEq/L   Chloride 106 96 - 112 mEq/L   CO2 28 19 - 32 mEq/L   Glucose, Bld 115 (H) 70 - 99 mg/dL   BUN 19 6 - 23 mg/dL   Creatinine, Ser 8.84 0.40 - 1.50 mg/dL   Total Bilirubin 0.5 0.2 - 1.2 mg/dL   Alkaline Phosphatase 92 39 - 117 U/L   AST 22 0 - 37 U/L   ALT 34 0 - 53 U/L   Total Protein 7.4 6.0 - 8.3 g/dL   Albumin 4.2 3.5 - 5.2 g/dL   GFR 34.83 >39.99 mL/min   Calcium 10.0 8.4 - 10.5 mg/dL  Amylase  Result Value Ref Range   Amylase 43 27 - 131 U/L  Lipase  Result Value Ref Range   Lipase 16.0 11.0 - 59.0 U/L    Office Visit on 02/04/2024  Component Date Value   WBC 02/04/2024 4.0    RBC 02/04/2024 5.25    Hemoglobin 02/04/2024 16.7    HCT 02/04/2024 49.6    MCV 02/04/2024 94.5    MCHC 02/04/2024 33.6    RDW 02/04/2024 13.9    Platelets 02/04/2024 171.0    Neutrophils Relative % 02/04/2024 60.9    Lymphocytes Relative 02/04/2024 26.7    Monocytes Relative 02/04/2024 9.9    Eosinophils Relative 02/04/2024 1.8    Basophils Relative 02/04/2024 0.7    Neutro Abs 02/04/2024 2.5    Lymphs Abs 02/04/2024 1.1    Monocytes Absolute 02/04/2024 0.4    Eosinophils Absolute 02/04/2024 0.1    Basophils Absolute 02/04/2024 0.0    Sodium 02/04/2024 141    Potassium 02/04/2024 4.5    Chloride 02/04/2024 106    CO2 02/04/2024 28    Glucose, Bld 02/04/2024 115 (H)    BUN 02/04/2024 19    Creatinine, Ser 02/04/2024 1.15    Total Bilirubin 02/04/2024 0.5    Alkaline Phosphatase 02/04/2024 92    AST 02/04/2024 22    ALT 02/04/2024 34    Total Protein 02/04/2024 7.4    Albumin 02/04/2024 4.2  GFR 02/04/2024 65.16    Calcium 02/04/2024 10.0    Amylase 02/04/2024 43    Lipase  02/04/2024 16.0     ASSESSMENT & PLAN   Assessment & Plan Indigestion Epigastric abdominal pain Epigastric pain with functional dyspepsia, suspected gastritis or peptic ulcer disease   He has experienced chronic epigastric pain for two months, mainly in the morning, accompanied by indigestion and gas. Previous GI workup, including endoscopy and GI series, was unremarkable. Symptoms suggest gastritis or peptic ulcer disease, with a differential diagnosis including gallstones and Helicobacter pylori infection. Severe pain affects daily life. Inconsistent pantoprazole use may contribute to symptoms, with potential rebound acid production. No NSAID use, alcohol , or stress is reported. Helicobacter pylori is considered a potential cause, but testing is less reliable while on Nexium . Discussed the possibility of gallstones and the need for imaging. Considered gluten intolerance and the need for a dietary trial. Switch pantoprazole to Nexium  40 mg daily, taken consistently at night. Use Maalox or Mylanta as needed for acute pain relief. Order a CT scan of the abdomen to evaluate for gallstones or other abnormalities. Order a Helicobacter pylori breath test. Order comprehensive lab work, including lipase, amylase, metabolic panel, and blood counts. Refer to a GI specialist for repeat endoscopy to evaluate for ulcers or other gastric pathology. Advise a gluten-free diet trial for two weeks to rule out gluten intolerance. Ensure lab results are sent to [facility] in [facility] for the upcoming appointment.   ORDER ASSOCIATIONS  #   DIAGNOSIS / CONDITION ICD-10 ENCOUNTER ORDER     ICD-10-CM   1. Indigestion  K30 Celiac Disease Comprehensive Panel with Reflexes    CT ABDOMEN PELVIS W CONTRAST    CBC with Differential/Platelet    Comp Met (CMET)    Amylase    Lipase    alum & mag hydroxide-simeth (MAALOX MAX) 400-400-40 MG/5ML suspension    H. pylori breath test    Ambulatory referral to  Gastroenterology    esomeprazole  (NEXIUM ) 40 MG capsule    DISCONTINUED: esomeprazole  (NEXIUM ) 40 MG capsule    CANCELED: Ambulatory referral to Gastroenterology    CANCELED: Ambulatory referral to Gastroenterology    2. Epigastric abdominal pain  R10.13 Celiac Disease Comprehensive Panel with Reflexes    CT ABDOMEN PELVIS W CONTRAST    CBC with Differential/Platelet    Comp Met (CMET)    Amylase    Lipase    alum & mag hydroxide-simeth (MAALOX MAX) 400-400-40 MG/5ML suspension    H. pylori breath test    Ambulatory referral to Gastroenterology    esomeprazole  (NEXIUM ) 40 MG capsule    DISCONTINUED: esomeprazole  (NEXIUM ) 40 MG capsule    CANCELED: Ambulatory referral to Gastroenterology    CANCELED: Ambulatory referral to Gastroenterology     ED Discharge Orders          Ordered    Celiac Disease Comprehensive Panel with Reflexes       Comments: Results to [facility] - [facility] [place], [place] [phone] (FAX)    02/04/24 1140    Ambulatory referral to Gastroenterology  Status:  Canceled       Comments: Results to [facility] - [facility] [place], [place] [address] [phone]   02/04/24 1140    CT ABDOMEN PELVIS W CONTRAST        02/04/24  1140    CBC with Differential/Platelet       Comments: Specimen [id number]: Results to [facility] - [facility] [place], [place] [phone] (FAX)    02/04/24 1140    Comp Met (CMET)       Comments: Specimen [id number]: Results to [facility] - [facility]  [place], [place] [phone]    02/04/24 1140    Amylase       Comments: Specimen [id number]: Results to [facility] - [facility] [place], [place] [phone]    02/04/24 1140    Lipase       Comments: Specimen [id number]: [facility] to [facility] - [facility] [place], [place] [phone] (FAX)    02/04/24 1140    esomeprazole  (NEXIUM ) 40 MG capsule  Daily,   Status:  Discontinued       Note to Pharmacy: Results to [facility] - [facility] [place], [place] [address] [phone] (FAX)   02/04/24 1140    alum & mag hydroxide-simeth (MAALOX MAX) 400-400-40 MG/5ML suspension  Every 6 hours PRN       Note to Pharmacy: Results to [facility] - [facility] [place], [place] [address] [phone] (FAX)   02/04/24 1140    H. pylori breath test       Comments: [facility] - [facility] [place], [place] [phone] (FAX)    02/04/24 1140    Ambulatory referral to Gastroenterology  Status:  Canceled       Comments: Following with gastroenterology at  [facility], new severe epigastric abdominal pain x 1-2 m   02/04/24 1144    Ambulatory referral to Gastroenterology       Comments: Following with gastroenterology at  [facility], new severe epigastric abdominal pain x 1-2 m\ Willing to see any provider that will get in sooner.   02/04/24  1144    esomeprazole  (NEXIUM ) 40 MG capsule  Daily at bedtime       Note to Pharmacy: Results to [facility] - [facility] [place], [place] [address] [phone]   02/04/24 1147    esomeprazole  (NEXIUM ) 40 MG capsule  Daily        02/04/24 1618          Diagnoses and all orders for this visit: Indigestion -     Celiac Disease Comprehensive Panel with Reflexes -     CT ABDOMEN PELVIS W CONTRAST; Future -     CBC with Differential/Platelet -     Comp Met (CMET) -     Amylase -     Lipase -     alum & mag hydroxide-simeth (MAALOX MAX) 400-400-40 MG/5ML suspension; Take 10 mLs by mouth every 6 (six) hours as needed for indigestion. -     H. pylori breath test -     Ambulatory referral to Gastroenterology -     esomeprazole  (NEXIUM ) 40 MG capsule; Take 1 capsule (40 mg total) by  mouth at bedtime. Replaces pantoprazole Epigastric abdominal pain -     Celiac Disease Comprehensive Panel with Reflexes -     CT ABDOMEN PELVIS W CONTRAST; Future -     CBC with Differential/Platelet -     Comp Met (CMET) -     Amylase -     Lipase -     alum & mag hydroxide-simeth (MAALOX MAX) 400-400-40 MG/5ML suspension; Take 10 mLs by mouth every 6 (six) hours as needed for indigestion. -     H. pylori breath test -     Ambulatory referral to Gastroenterology -     esomeprazole  (NEXIUM ) 40 MG capsule; Take 1 capsule (40 mg total) by mouth at bedtime. Replaces pantoprazole Other orders -     esomeprazole  (NEXIUM ) 40 MG capsule; Take 1 capsule (40 mg total) by mouth daily.     Recommended follow up: No follow-ups on file.No future appointments.           Additional notes: This document was synthesized by artificial intelligence (Abridge) using HIPAA-compliant recording of the clinical interaction;   We discussed the use of AI scribe software for clinical note  transcription with the patient, who gave verbal consent to proceed.    Additional Info: This encounter employed state-of-the-art, real-time, collaborative documentation. The patient actively reviewed and assisted in updating their electronic medical record on a shared screen, ensuring transparency and facilitating joint problem-solving for the problem list, overview, and plan. This approach promotes accurate, informed care. The treatment plan was discussed and reviewed in detail, including medication safety, potential side effects, and all patient questions. We confirmed understanding and comfort with the plan. Follow-up instructions were established, including contacting the office for any concerns, returning if symptoms worsen, persist, or new symptoms develop, and precautions for potential emergency department visits.  Initial Appointment Goals:  This initial visit focused on establishing a foundation for the patient's care. We collaboratively reviewed his medical history and medications in detail, updating the chart as shown in the encounter. Given the extensive information, we prioritized addressing his most pressing concerns, which he reported were: New Pt (Pt is present to est care with pcp) and Abdominal Pain (Pain wakes up with stomach pains and going on for years now. It is every day now even if he eats hurts. Stomach ache all the time comes and goes.  Has done been to GI doctor and all can not find out what's going. )  While the complexity of the patient's medical picture may necessitate further evaluation in subsequent visits, we were able to develop a preliminary care plan together. To expedite a comprehensive plan at the next visit, we encouraged the patient to gather relevant medical records from previous providers. This collaborative approach will ensure a more complete understanding of the patient's health and inform the development of a personalized care plan. We look forward to continuing  the conversation and working together with the patient on achieving his health goals.   Collaborative Documentation:  Today's encounter utilized real-time, dynamic patient engagement.  Patients actively participate by directly reviewing and assisting in updating their medical records through a shared screen. This transparency empowers patients to visually confirm chart updates made by the healthcare provider.  This collaborative approach facilitates problem management as we jointly update the problem list, problem overview, and assessment/plan. Ultimately, this process enhances chart accuracy and completeness, fostering shared decision-making, patient education, and informed consent for tests and treatments.  Collaborative Treatment Planning:  Treatment plans were discussed and  reviewed in detail.  Explained medication safety and potential side effects.  Encouraged participation and answered all patient questions, confirming understanding and comfort with the plan. Encouraged patient to contact our office if they have any questions or concerns. Agreed on patient returning to office if symptoms worsen, persist, or new symptoms develop.  ----------------------------------------------------- Bernardino KANDICE Cone, MD  02/04/2024 7:56 PM  [facility]:  [phone]

## 2024-02-04 NOTE — Patient Instructions (Signed)
 VISIT SUMMARY: Today, you were seen for chronic abdominal pain that has been troubling you for the past two months, especially in the mornings. We discussed your symptoms, including the pain, gas, and your history of gastrointestinal evaluations. We also reviewed your current medications and lifestyle habits.  YOUR PLAN: -EPIGASTRIC PAIN WITH FUNCTIONAL DYSPEPSIA, SUSPECTED GASTRITIS OR PEPTIC ULCER DISEASE: Your chronic abdominal pain may be due to gastritis or peptic ulcer disease, which are conditions that cause inflammation or sores in the stomach lining. We will switch your medication to Nexium  40 mg daily, to be taken consistently at night, and you can use Maalox or Mylanta as needed for pain relief. We will also conduct a CT scan of your abdomen to check for gallstones or other issues, and a Helicobacter pylori breath test to identify any bacterial infection. Additionally, we will do comprehensive lab work and refer you to a GI specialist for a repeat endoscopy. Please try a gluten-free diet for two weeks to see if it helps with your symptoms.  INSTRUCTIONS: Please take Nexium  40 mg daily at night and use Maalox or Mylanta as needed for pain relief. Schedule a CT scan of your abdomen and a Helicobacter pylori breath test. Complete the comprehensive lab work, including lipase, amylase, metabolic panel, and blood counts. Follow up with a GI specialist for a repeat endoscopy. Try a gluten-free diet for two weeks. Ensure all lab results are sent to [facility] in [facility] for your upcoming appointment.  Welcome aboard!   Today's visit was a valuable first step in understanding your health and starting your personalized care journey. We discussed your medical history and medications in detail. Given the extensive information, we prioritized addressing your most pressing concerns.  We understood those concerns to be:  New Pt (Pt is present to est care with pcp) and Abdominal Pain (Pain  wakes up with stomach pains and going on for years now. It is every day now even if he eats hurts. Stomach ache all the time comes and goes.  Has done been to GI doctor and all can not find out what's going. )   Building a Complete Picture  To create the most effective care plan possible, we may need additional information from previous providers. We encouraged you to gather any relevant medical records for your next visit. This will help us  build a more complete picture and develop a personalized plan together. In the meantime, we'll address your immediate concerns and provide resources to help you manage all of your medical issues.  We encourage you to use MyChart to review these efforts, and to help us  find and correct any omissions or errors in your medical chart.  Managing Your Health Over Time  Managing every aspect of your health in a single visit isn't always feasible, but that's okay.  We addressed your most pressing concerns today and charted a course for future care. Acute conditions or preventive care measures may require further attention.  We encourage you to schedule a follow-up visit at your earliest convenience to discuss any unresolved issues.  We strongly encourage participation in annual preventive care visits to help us  develop a more thorough understanding of your health and to help you maintain optimal wellness - please inquire about scheduling your next one with us  at your earliest convenience.  Your Satisfaction Matters  It was a pleasure seeing you today!  Your health and satisfaction will always be my top priorities. If you believe your experience today  was worthy of a 5-star rating, I'd be grateful for your feedback!  Bernardino KANDICE Cone, MD   Next Steps  Schedule Follow-Up:  We recommend a follow-up appointment in No follow-ups on file. If your condition worsens before then, please call us  or seek emergency care. Preventive Care:  Don't forget to schedule your annual  preventive care visit!  This important checkup is typically covered by insurance and helps identify potential health issues early.  Typically its 100% insurance covered with no co-pay and helps to get surveillance labwork paid for through your insurance provider.  Sometimes it even lowers your insurance premiums to participate. Medical Information Release:  For any relevant medical information we don't have, please sign a release form so we can obtain it for your records. Lab & X-ray Appointments:  Scheduled any incomplete lab tests today or call us  to schedule.  X-Rays can be done without an appointment at [facility] at [facility] ([address]), M-F 8:30am-noon or 1pm-5pm.  Just tell them you're there for X-rays ordered by Dr. Cone.  We'll receive the results and contact you by phone or MyChart to discuss next steps.  Bring to Your Next Appointment  Medications: Please bring all your medication bottles to your next appointment to ensure we have an accurate record of your prescriptions. Health Diaries: If you're monitoring any health conditions at home, keeping a diary of your readings can be very helpful for discussions at your next appointment.  Reviewing Your Records  Please Review this early draft of your clinical notes below and the final encounter summary tomorrow on MyChart after its been completed.   Indigestion -     Celiac Disease Comprehensive Panel with Reflexes -     CT ABDOMEN PELVIS W CONTRAST; Future -     CBC with Differential/Platelet -     Comprehensive metabolic panel with GFR -     Amylase -     Lipase -     Maalox Max; Take 10 mLs by mouth every 6 (six) hours as needed for indigestion.  Dispense: 355 mL; Refill: 0 -     H. pylori breath test -     Ambulatory referral to Gastroenterology -     Esomeprazole  Magnesium ; Take 1 capsule (40 mg total) by mouth at bedtime. Replaces pantoprazole  Dispense: 30 capsule; Refill: 3  Epigastric abdominal pain -      Celiac Disease Comprehensive Panel with Reflexes -     CT ABDOMEN PELVIS W CONTRAST; Future -     CBC with Differential/Platelet -     Comprehensive metabolic panel with GFR -     Amylase -     Lipase -     Maalox Max; Take 10 mLs by mouth every 6 (six) hours as needed for indigestion.  Dispense: 355 mL; Refill: 0 -     H. pylori breath test -     Ambulatory referral to Gastroenterology -     Esomeprazole  Magnesium ; Take 1 capsule (40 mg total) by mouth at bedtime. Replaces pantoprazole  Dispense: 30 capsule; Refill: 3  Other orders -     Esomeprazole  Magnesium ; Take 1 capsule (40 mg total) by mouth daily.  Dispense: 30 capsule; Refill: 3     Getting Answers and Following Up  Simple Questions & Concerns: For quick questions or basic follow-up after your visit, reach us  at [phone] or MyChart messaging. Complex Concerns: If your concern is more complex, scheduling an appointment might be  best. Discuss this with the staff to find the most suitable option. Lab & Imaging Results: We'll contact you directly if results are abnormal or you don't use MyChart. Most normal results will be on MyChart within 2-3 business days, with a review message from Dr. Jesus. Haven't heard back in 2 weeks? Need results sooner? Contact us  at [phone]. Referrals: Our referral coordinator will manage specialist referrals. The specialist's office should contact you within 2 weeks to schedule an appointment. Call us  if you haven't heard from them after 2 weeks.  Staying Connected  MyChart: Activate your MyChart for the fastest way to access results and message us . See the last page of this paperwork for instructions.  Billing  X-ray & Lab Orders: These are billed by separate companies. Contact the invoicing company directly for questions or concerns. Visit Charges: Discuss any billing inquiries with our administrative services team.  Feedback & Satisfaction  Share Your Experience: We strive  for your satisfaction! If you have any complaints, please let Dr. Jesus know directly or contact our Practice Administrators, Manuelita Rubin or [employer], by asking at the front desk.  Scheduling Tips  Shorter Wait Times: 8 am and 1 pm appointments often have the quickest wait times. Longer Appointments: If you need more time during your visit, talk to the front desk. Due to insurance regulations, multiple back-to-back appointments might be necessary.

## 2024-02-04 NOTE — Addendum Note (Signed)
 Addended by: Keiley Levey G on: 02/04/2024 09:17 PM   Modules accepted: Orders

## 2024-02-05 LAB — CELIAC DISEASE COMPREHENSIVE PANEL WITH REFLEXES
(tTG) Ab, IgA: <1 U/mL
Immunoglobulin A: 387 mg/dL — ABNORMAL HIGH (ref 70–320)

## 2024-02-07 ENCOUNTER — Telehealth: Payer: Self-pay | Admitting: Gastroenterology

## 2024-02-07 NOTE — Telephone Encounter (Signed)
 Good Morning  Dr. San  (DOD)  I Received a call from patient stating he was referred by his provider to get in for sooner colonoscopy. He is requesting a transfer of care to us . Please review and advise.  Thank you!

## 2024-02-09 ENCOUNTER — Ambulatory Visit: Payer: Self-pay | Admitting: Internal Medicine

## 2024-02-15 NOTE — Telephone Encounter (Signed)
 Good afternoon Dr. San,   I received a call from this patient stating that he spoke with [facility] and advised that it will be several month until he could actually have his colonoscopy. Patient stated that his PCP is requesting for him to have a urgent colonoscopy. Patient is wanting to transfer his care over to us . Would you please advise on scheduling this patient.   Thank you.

## 2024-02-18 ENCOUNTER — Other Ambulatory Visit: Payer: Self-pay

## 2024-02-18 DIAGNOSIS — R1013 Epigastric pain: Secondary | ICD-10-CM

## 2024-02-18 DIAGNOSIS — K30 Functional dyspepsia: Secondary | ICD-10-CM

## 2024-02-21 ENCOUNTER — Ambulatory Visit (INDEPENDENT_AMBULATORY_CARE_PROVIDER_SITE_OTHER): Payer: Medicare (Managed Care) | Admitting: Internal Medicine

## 2024-02-21 ENCOUNTER — Encounter: Payer: Self-pay | Admitting: Internal Medicine

## 2024-02-21 VITALS — BP 110/72 | HR 86 | Temp 98.0°F | Ht 71.0 in | Wt 230.8 lb

## 2024-02-21 DIAGNOSIS — R198 Other specified symptoms and signs involving the digestive system and abdomen: Secondary | ICD-10-CM | POA: Diagnosis not present

## 2024-02-21 DIAGNOSIS — G4709 Other insomnia: Secondary | ICD-10-CM | POA: Diagnosis not present

## 2024-02-21 DIAGNOSIS — Z23 Encounter for immunization: Secondary | ICD-10-CM | POA: Diagnosis not present

## 2024-02-21 DIAGNOSIS — R29818 Other symptoms and signs involving the nervous system: Secondary | ICD-10-CM | POA: Insufficient documentation

## 2024-02-21 MED ORDER — TRAZODONE HCL 100 MG PO TABS: 100.0000 mg | ORAL_TABLET | Freq: Every day | ORAL | 3 refills | Status: DC

## 2024-02-21 MED ORDER — MIRTAZAPINE 15 MG PO TABS: 15.0000 mg | ORAL_TABLET | Freq: Every day | ORAL | 3 refills | Status: AC

## 2024-02-21 NOTE — Patient Instructions (Addendum)
 Memory foam pillow will prevent neck pain with sleep.   It was a pleasure seeing you today! Your health and satisfaction are our top priorities.  Bernardino Cone, MD  VISIT SUMMARY: Today, we discussed your ongoing issues with insomnia and gastrointestinal problems. We reviewed your current medications and symptoms, and made some adjustments to your treatment plan. We also talked about the possibility of sleep apnea and the need for further evaluation.  YOUR PLAN: -CHRONIC INSOMNIA: Chronic insomnia means having trouble sleeping for a long period of time. We will reduce your mirtazapine  dose from 45 mg to 15 mg and continue trazodone  100 mg as needed for sleep. You should try using trazodone  less frequently to see if it helps. We also recommend using a memory foam pillow and visiting [employer] for more information on insomnia treatments. We will follow up in one month to see how you are doing.  -SUSPECTED SLEEP APNEA: Sleep apnea is a condition where you stop breathing for short periods while sleeping. Your symptoms suggest you might have sleep apnea, so we are referring you for a sleep study to confirm this. If diagnosed, we will discuss treatments like CPAP.  -GASTROINTESTINAL ISSUES: You have ongoing stomach pain that is worse in the morning and gets better by the afternoon. You have an appointment with a gastroenterologist on the 16th, where you should discuss the possibility of having an endoscopy and colonoscopy to investigate further.  INSTRUCTIONS: Please follow up in one month to assess how the changes to your insomnia treatment are working. Make sure to attend your gastroenterologist appointment on the 16th and discuss the recommended procedures. Also, schedule a sleep study to evaluate for sleep apnea. Use MyChart for any questions or concerns before your next visit.  Your Providers PCP: Cone Bernardino MATSU, MD,  [phone]) Referring Provider: Cone Bernardino MATSU, MD,   [phone])  NEXT STEPS: [x]  Early Intervention: Schedule sooner appointment, call our on-call services, or go to emergency room if there is any significant Increase in pain or discomfort New or worsening symptoms Sudden or severe changes in your health [x]  Flexible Follow-Up: We recommend a Return in about 1 month (around 03/22/2024). for optimal routine care. This allows for progress monitoring and treatment adjustments. [x]  Preventive Care: Schedule your annual preventive care visit! It's typically covered by insurance and helps identify potential health issues early. [x]  Lab & X-ray Appointments: Incomplete tests scheduled today, or call to schedule. X-rays: [facility] (M-F, 8:30am-noon or 1pm-5pm). [x]  Medical Information Release: Sign a release form at front desk to obtain relevant medical information we don't have.  MAKING THE MOST OF OUR FOCUSED 20 MINUTE APPOINTMENTS: [x]   Clearly state your top concerns at the beginning of the visit to focus our discussion [x]   If you anticipate you will need more time, please inform the front desk during scheduling - we can book multiple appointments in the same week. [x]   If you have transportation problems- use our convenient video appointments or ask about transportation support. [x]   We can get down to business faster if you use MyChart to update information before the visit and submit non-urgent questions before your visit. Thank you for taking the time to provide details through MyChart.  Let our nurse know and she can import this information into your encounter documents.  Arrival and Wait Times: [x]   Arriving on time ensures that everyone receives prompt attention. [x]   Early morning (8a) and afternoon (1p) appointments tend to have shortest wait times. [  x]  Unfortunately, we cannot delay appointments for late arrivals or hold slots during phone calls.  Getting Answers and Following Up [x]   Simple Questions & Concerns:  For quick questions or basic follow-up after your visit, reach us  at [phone] or MyChart messaging. [x]   Complex Concerns: If your concern is more complex, scheduling an appointment might be best. Discuss this with the staff to find the most suitable option. [x]   Lab & Imaging Results: We'll contact you directly if results are abnormal or you don't use MyChart. Most normal results will be on MyChart within 2-3 business days, with a review message from Dr. Jesus. Haven't heard back in 2 weeks? Need results sooner? Contact us  at [phone]. [x]   Referrals: Our referral coordinator will manage specialist referrals. The specialist's office should contact you within 2 weeks to schedule an appointment. Call us  if you haven't heard from them after 2 weeks.  Staying Connected [x]   MyChart: Activate your MyChart for the fastest way to access results and message us . See the last page of this paperwork for instructions on how to activate.  Bring to Your Next Appointment [x]   Medications: Please bring all your medication bottles to your next appointment to ensure we have an accurate record of your prescriptions. [x]   Health Diaries: If you're monitoring any health conditions at home, keeping a diary of your readings can be very helpful for discussions at your next appointment.  Billing [x]   X-ray & Lab Orders: These are billed by separate companies. Contact the invoicing company directly for questions or concerns. [x]   Visit Charges: Discuss any billing inquiries with our administrative services team.  Your Satisfaction Matters [x]   Share Your Experience: We strive for your satisfaction! If you have any complaints, or preferably compliments, please let Dr. Jesus know directly or contact our Practice Administrators, Manuelita Rubin or [employer], by asking at the front desk.   Reviewing Your Records [x]   Review this early draft of your clinical encounter notes below and the final encounter  summary tomorrow on MyChart after its been completed.  All orders placed so far are visible here: Suspected sleep apnea -     traZODone  HCl; Take 1 tablet (100 mg total) by mouth at bedtime. Take intermittently only as needed.  Dispense: 90 tablet; Refill: 3 -     Mirtazapine ; Take 1 tablet (15 mg total) by mouth at bedtime. Replaces 45 mg dose  Dispense: 90 tablet; Refill: 3 -     Ambulatory referral to Sleep Studies  Other insomnia -     traZODone  HCl; Take 1 tablet (100 mg total) by mouth at bedtime. Take intermittently only as needed.  Dispense: 90 tablet; Refill: 3 -     Mirtazapine ; Take 1 tablet (15 mg total) by mouth at bedtime. Replaces 45 mg dose  Dispense: 90 tablet; Refill: 3 -     Ambulatory referral to Sleep Studies  Epigastric heaviness  Other orders -     Flu vaccine HIGH DOSE PF(Fluzone Trivalent)

## 2024-02-21 NOTE — Progress Notes (Signed)
 ==============================  [facility]: [phone]   -- Medical Office Visit --  Patient: Lawrence Carlson      Age: 68 y.o.       Sex:  male  Date:   02/21/2024 Today's Healthcare Provider: Bernardino KANDICE Cone, MD  ==============================   Chief Complaint: Insomnia Also gastrointestinal situation   Discussed the use of AI scribe software for clinical note transcription with the patient, who gave verbal consent to proceed.  History of Present Illness 68 year old male who presents with insomnia and gastrointestinal issues.  He has experienced insomnia for the past two years, with symptoms worsening over the last few months. His sleep pattern involves sleeping well for two to three days, followed by two to three days of poor sleep. He notes that his insomnia is not related to his gastrointestinal symptoms, as he did not sleep last night despite no stomach issues. Current medications for sleep include mirtazapine  and trazodone , which he feels are not effective. He also takes Seroquel, prescribed for bipolar disorder, though he questions this diagnosis.  He has a history of gastrointestinal issues and has an upcoming appointment with a GI specialist on the 16th. He experiences stomach pain primarily in the morning, which subsides by the afternoon.  He mentions a past diagnosis of bipolar disorder, which he doubts, and attributes to a possible traumatic brain injury. He denies classic symptoms of bipolar disorder, such as staying up all night or losing track of time. He is currently on Seroquel for this condition.  He experiences dry mouth and poor sleep and has not been tested for sleep apnea.  Background Reviewed: Problem List: has Anticoagulated on Coumadin; Anxiety; Chronic gout; DJD (degenerative joint disease) of cervical spine; Dysphonia; Enlarged heart; Hx pulmonary embolism; Hyperlipidemia; Insomnia; Lung nodule; Paralysis of vocal cords  and larynx, unilateral; Osteoarthritis of left knee; and Suspected sleep apnea on their problem list. 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 Past Surgical History:   has a past surgical history that includes Throat surgery; Appendectomy; and Knee Arthroplasty (Left, 06/29/2021). Social History:   reports that he has never smoked. He has never used smokeless tobacco. He reports that he does not drink alcohol  and does not use drugs. Family History:  family history includes Alcohol  abuse in his father and mother. Allergies:  has no known allergies.   Medication Reconciliation: Current Outpatient Medications on File Prior to Visit  Medication Sig   mirtazapine  (REMERON ) 45 MG tablet Take 45 mg by mouth at bedtime.   OLANZapine  (ZYPREXA ) 10 MG tablet TAKE ONE AND ONE-HALF TABLETS (15 MG TOTAL) BY MOUTH DAILY   ondansetron  (ZOFRAN ) 4 MG tablet Take 1 tablet (4 mg total) by mouth every 8 (eight) hours as needed for nausea or vomiting.   QUEtiapine (SEROQUEL) 100 MG tablet Take 100 mg by mouth 2 (two) times daily.   traZODone  (DESYREL ) 100 MG tablet TAKE ONE AND ONE-HALF TABLETS BY MOUTH AT BEDTIME   Vilazodone  HCl 20 MG TABS Take 1 tablet (20 mg total) by mouth daily after breakfast.   alum & mag hydroxide-simeth (MAALOX MAX) 400-400-40 MG/5ML suspension Take 10 mLs by mouth every 6 (six) hours as needed for indigestion.   atorvastatin (LIPITOR) 10 MG tablet Take 10 mg by mouth daily.  Cyanocobalamin  (B-12 PO) Take 1 tablet by mouth daily.   esomeprazole  (NEXIUM ) 40 MG capsule Take 1 capsule (40 mg total) by mouth daily.   esomeprazole  (NEXIUM ) 40 MG capsule Take 1 capsule (40 mg total) by mouth at bedtime. Replaces pantoprazole   LORazepam   (ATIVAN ) 1 MG tablet TAKE ONE TABLET BY MOUTH EVERY 8 HOURS   No current facility-administered medications on file prior to visit.  There are no discontinued medications.   Physical Exam:    02/21/2024    8:36 AM 02/04/2024   10:44 AM 07/11/2022    7:30 PM  Vitals with BMI  Height 5' 11 5' 11   Weight 230 lbs 13 oz 230 lbs 6 oz   BMI 32.2 32.15   Systolic 110 126 862  Diastolic 72 84 82  Pulse 86 93   Vital signs reviewed.  Nursing notes reviewed. Weight trend reviewed. Physical Activity: Not on file   General Appearance:  No acute distress appreciable.   Well-groomed, healthy-appearing male.  Well proportioned with no abnormal fat distribution.  Good muscle tone. Pulmonary:  Normal work of breathing at rest, no respiratory distress apparent. SpO2: 98 %  Musculoskeletal: All extremities are intact.  Neurological:  Awake, alert, oriented, and engaged.  No obvious focal neurological deficits or cognitive impairments.  Sensorium seems unclouded.   Speech is clear and coherent with logical content. Psychiatric:  Appropriate mood, pleasant and cooperative demeanor, thoughtful and engaged during the exam   Verbalized to patient: Physical Exam    Results:   Verbalized to patient: Results      02/04/2024   10:53 AM  PHQ 2/9 Scores  PHQ - 2 Score 0   Office Visit on 02/04/2024  Component Date Value Ref Range Status   INTERPRETATION 02/04/2024    Final   (tTG) Ab, IgA 02/04/2024 <1.0  U/mL Final   Immunoglobulin A 02/04/2024 387 (H)  70 - 320 mg/dL Final   WBC 90/91/7974 4.0  4.0 - 10.5 K/uL Final   RBC 02/04/2024 5.25  4.22 - 5.81 Mil/uL Final   Hemoglobin 02/04/2024 16.7  13.0 - 17.0 g/dL Final   HCT 90/91/7974 49.6  39.0 - 52.0 % Final   MCV 02/04/2024 94.5  78.0 - 100.0 fl Final   MCHC 02/04/2024 33.6  30.0 - 36.0 g/dL Final   RDW 90/91/7974 13.9  11.5 - 15.5 % Final   Platelets 02/04/2024 171.0  150.0 - 400.0 K/uL Final   Neutrophils Relative % 02/04/2024 60.9  43.0  - 77.0 % Final   Lymphocytes Relative 02/04/2024 26.7  12.0 - 46.0 % Final   Monocytes Relative 02/04/2024 9.9  3.0 - 12.0 % Final   Eosinophils Relative 02/04/2024 1.8  0.0 - 5.0 % Final   Basophils Relative 02/04/2024 0.7  0.0 - 3.0 % Final   Neutro Abs 02/04/2024 2.5  1.4 - 7.7 K/uL Final   Lymphs Abs 02/04/2024 1.1  0.7 - 4.0 K/uL Final   Monocytes Absolute 02/04/2024 0.4  0.1 - 1.0 K/uL Final   Eosinophils Absolute 02/04/2024 0.1  0.0 - 0.7 K/uL Final   Basophils Absolute 02/04/2024 0.0  0.0 - 0.1 K/uL Final   Sodium 02/04/2024 141  135 - 145 mEq/L Final   Potassium 02/04/2024 4.5  3.5 - 5.1 mEq/L Final   Chloride 02/04/2024 106  96 - 112 mEq/L Final   CO2 02/04/2024 28  19 - 32 mEq/L Final   Glucose, Bld 02/04/2024 115 (H)  70 - 99 mg/dL Final  BUN 02/04/2024 19  6 - 23 mg/dL Final   Creatinine, Ser 02/04/2024 1.15  0.40 - 1.50 mg/dL Final   Total Bilirubin 02/04/2024 0.5  0.2 - 1.2 mg/dL Final   Alkaline Phosphatase 02/04/2024 92  39 - 117 U/L Final   AST 02/04/2024 22  0 - 37 U/L Final   ALT 02/04/2024 34  0 - 53 U/L Final   Total Protein 02/04/2024 7.4  6.0 - 8.3 g/dL Final   Albumin 90/91/7974 4.2  3.5 - 5.2 g/dL Final   GFR 90/91/7974 65.16  >60.00 mL/min Final   Calcium 02/04/2024 10.0  8.4 - 10.5 mg/dL Final   Amylase 90/91/7974 43  27 - 131 U/L Final   Lipase 02/04/2024 16.0  11.0 - 59.0 U/L Final  Admission on 07/11/2022, Discharged on 07/11/2022  Component Date Value Ref Range Status   WBC 07/11/2022 5.8  4.0 - 10.5 K/uL Final   RBC 07/11/2022 5.07  4.22 - 5.81 MIL/uL Final   Hemoglobin 07/11/2022 16.1  13.0 - 17.0 g/dL Final   HCT 97/86/7975 45.7  39.0 - 52.0 % Final   MCV 07/11/2022 90.1  80.0 - 100.0 fL Final   MCH 07/11/2022 31.8  26.0 - 34.0 pg Final   MCHC 07/11/2022 35.2  30.0 - 36.0 g/dL Final   RDW 97/86/7975 12.5  11.5 - 15.5 % Final   Platelets 07/11/2022 194  150 - 400 K/uL Final   nRBC 07/11/2022 0.0  0.0 - 0.2 % Final   Troponin I (High  Sensitivity) 07/11/2022 4  <18 ng/L Final   Sodium 07/11/2022 138  135 - 145 mmol/L Final   Potassium 07/11/2022 3.6  3.5 - 5.1 mmol/L Final   Chloride 07/11/2022 109  98 - 111 mmol/L Final   CO2 07/11/2022 22  22 - 32 mmol/L Final   Glucose, Bld 07/11/2022 114 (H)  70 - 99 mg/dL Final   BUN 97/86/7975 16  8 - 23 mg/dL Final   Creatinine, Ser 07/11/2022 0.99  0.61 - 1.24 mg/dL Final   Calcium 97/86/7975 9.0  8.9 - 10.3 mg/dL Final   GFR, Estimated 07/11/2022 >60  >60 mL/min Final   Anion gap 07/11/2022 7  5 - 15 Final   Troponin I (High Sensitivity) 07/11/2022 4  <18 ng/L Final  Admission on 06/16/2022, Discharged on 06/16/2022  Component Date Value Ref Range Status   Sodium 06/16/2022 139  135 - 145 mmol/L Final   Potassium 06/16/2022 3.7  3.5 - 5.1 mmol/L Final   Chloride 06/16/2022 105  98 - 111 mmol/L Final   CO2 06/16/2022 25  22 - 32 mmol/L Final   Glucose, Bld 06/16/2022 117 (H)  70 - 99 mg/dL Final   BUN 98/80/7975 15  8 - 23 mg/dL Final   Creatinine, Ser 06/16/2022 1.07  0.61 - 1.24 mg/dL Final   Calcium 98/80/7975 9.1  8.9 - 10.3 mg/dL Final   GFR, Estimated 06/16/2022 >60  >60 mL/min Final   Anion gap 06/16/2022 9  5 - 15 Final   Troponin I (High Sensitivity) 06/16/2022 3  <18 ng/L Final   WBC 06/16/2022 6.2  4.0 - 10.5 K/uL Final   RBC 06/16/2022 4.91  4.22 - 5.81 MIL/uL Final   Hemoglobin 06/16/2022 15.5  13.0 - 17.0 g/dL Final   HCT 98/80/7975 45.6  39.0 - 52.0 % Final   MCV 06/16/2022 92.9  80.0 - 100.0 fL Final   MCH 06/16/2022 31.6  26.0 - 34.0 pg Final   MCHC  06/16/2022 34.0  30.0 - 36.0 g/dL Final   RDW 98/80/7975 12.6  11.5 - 15.5 % Final   Platelets 06/16/2022 205  150 - 400 K/uL Final   nRBC 06/16/2022 0.0  0.0 - 0.2 % Final   Neutrophils Relative % 06/16/2022 78  % Final   Neutro Abs 06/16/2022 4.9  1.7 - 7.7 K/uL Final   Lymphocytes Relative 06/16/2022 15  % Final   Lymphs Abs 06/16/2022 0.9  0.7 - 4.0 K/uL Final   Monocytes Relative 06/16/2022 5  %  Final   Monocytes Absolute 06/16/2022 0.3  0.1 - 1.0 K/uL Final   Eosinophils Relative 06/16/2022 1  % Final   Eosinophils Absolute 06/16/2022 0.0  0.0 - 0.5 K/uL Final   Basophils Relative 06/16/2022 1  % Final   Basophils Absolute 06/16/2022 0.0  0.0 - 0.1 K/uL Final   Immature Granulocytes 06/16/2022 0  % Final   Abs Immature Granulocytes 06/16/2022 0.02  0.00 - 0.07 K/uL Final   Troponin I (High Sensitivity) 06/16/2022 4  <18 ng/L Final  Admission on 05/29/2022, Discharged on 05/29/2022  Component Date Value Ref Range Status   Sodium 05/29/2022 139  135 - 145 mmol/L Final   Potassium 05/29/2022 3.6  3.5 - 5.1 mmol/L Final   Chloride 05/29/2022 106  98 - 111 mmol/L Final   CO2 05/29/2022 23  22 - 32 mmol/L Final   Glucose, Bld 05/29/2022 140 (H)  70 - 99 mg/dL Final   BUN 98/98/7975 14  8 - 23 mg/dL Final   Creatinine, Ser 05/29/2022 0.89  0.61 - 1.24 mg/dL Final   Calcium 98/98/7975 9.4  8.9 - 10.3 mg/dL Final   GFR, Estimated 05/29/2022 >60  >60 mL/min Final   Anion gap 05/29/2022 10  5 - 15 Final   WBC 05/29/2022 5.6  4.0 - 10.5 K/uL Final   RBC 05/29/2022 4.76  4.22 - 5.81 MIL/uL Final   Hemoglobin 05/29/2022 15.2  13.0 - 17.0 g/dL Final   HCT 98/98/7975 43.4  39.0 - 52.0 % Final   MCV 05/29/2022 91.2  80.0 - 100.0 fL Final   MCH 05/29/2022 31.9  26.0 - 34.0 pg Final   MCHC 05/29/2022 35.0  30.0 - 36.0 g/dL Final   RDW 98/98/7975 12.5  11.5 - 15.5 % Final   Platelets 05/29/2022 178  150 - 400 K/uL Final   nRBC 05/29/2022 0.0  0.0 - 0.2 % Final   Troponin I (High Sensitivity) 05/29/2022 7  <18 ng/L Final   Troponin I (High Sensitivity) 05/29/2022 7  <18 ng/L Final   SARS Coronavirus 2 by RT PCR 05/29/2022 NEGATIVE  NEGATIVE Final   Influenza A by PCR 05/29/2022 NEGATIVE  NEGATIVE Final   Influenza B by PCR 05/29/2022 NEGATIVE  NEGATIVE Final   Resp Syncytial Virus by PCR 05/29/2022 NEGATIVE  NEGATIVE Final  Admission on 05/15/2022, Discharged on 05/16/2022  Component Date  Value Ref Range Status   SARS Coronavirus 2 by RT PCR 05/15/2022 NEGATIVE  NEGATIVE Final   Influenza A by PCR 05/15/2022 NEGATIVE  NEGATIVE Final   Influenza B by PCR 05/15/2022 NEGATIVE  NEGATIVE Final   Resp Syncytial Virus by PCR 05/15/2022 NEGATIVE  NEGATIVE Final   Lipase 05/15/2022 29  11 - 51 U/L Final   Sodium 05/15/2022 141  135 - 145 mmol/L Final   Potassium 05/15/2022 3.5  3.5 - 5.1 mmol/L Final   Chloride 05/15/2022 107  98 - 111 mmol/L Final   CO2 05/15/2022 28  22 - 32 mmol/L Final   Glucose, Bld 05/15/2022 99  70 - 99 mg/dL Final   BUN 87/81/7976 15  8 - 23 mg/dL Final   Creatinine, Ser 05/15/2022 0.89  0.61 - 1.24 mg/dL Final   Calcium 87/81/7976 9.3  8.9 - 10.3 mg/dL Final   Total Protein 87/81/7976 7.3  6.5 - 8.1 g/dL Final   Albumin 87/81/7976 3.8  3.5 - 5.0 g/dL Final   AST 87/81/7976 26  15 - 41 U/L Final   ALT 05/15/2022 23  0 - 44 U/L Final   Alkaline Phosphatase 05/15/2022 75  38 - 126 U/L Final   Total Bilirubin 05/15/2022 0.6  0.3 - 1.2 mg/dL Final   GFR, Estimated 05/15/2022 >60  >60 mL/min Final   Anion gap 05/15/2022 6  5 - 15 Final   WBC 05/15/2022 5.3  4.0 - 10.5 K/uL Final   RBC 05/15/2022 4.78  4.22 - 5.81 MIL/uL Final   Hemoglobin 05/15/2022 15.3  13.0 - 17.0 g/dL Final   HCT 87/81/7976 44.0  39.0 - 52.0 % Final   MCV 05/15/2022 92.1  80.0 - 100.0 fL Final   MCH 05/15/2022 32.0  26.0 - 34.0 pg Final   MCHC 05/15/2022 34.8  30.0 - 36.0 g/dL Final   RDW 87/81/7976 12.5  11.5 - 15.5 % Final   Platelets 05/15/2022 195  150 - 400 K/uL Final   nRBC 05/15/2022 0.0  0.0 - 0.2 % Final   Color, Urine 05/15/2022 YELLOW  YELLOW Final   APPearance 05/15/2022 CLEAR  CLEAR Final   Specific Gravity, Urine 05/15/2022 1.020  1.005 - 1.030 Final   pH 05/15/2022 7.0  5.0 - 8.0 Final   Glucose, UA 05/15/2022 NEGATIVE  NEGATIVE mg/dL Final   Hgb urine dipstick 05/15/2022 NEGATIVE  NEGATIVE Final   Bilirubin Urine 05/15/2022 NEGATIVE  NEGATIVE Final   Ketones, ur  05/15/2022 NEGATIVE  NEGATIVE mg/dL Final   Protein, ur 87/81/7976 NEGATIVE  NEGATIVE mg/dL Final   Nitrite 87/81/7976 NEGATIVE  NEGATIVE Final   Leukocytes,Ua 05/15/2022 NEGATIVE  NEGATIVE Final  Admission on 04/06/2022, Discharged on 04/06/2022  Component Date Value Ref Range Status   Sodium 04/06/2022 143  135 - 145 mmol/L Final   Potassium 04/06/2022 3.9  3.5 - 5.1 mmol/L Final   Chloride 04/06/2022 106  98 - 111 mmol/L Final   CO2 04/06/2022 29  22 - 32 mmol/L Final   Glucose, Bld 04/06/2022 105 (H)  70 - 99 mg/dL Final   BUN 88/90/7976 17  8 - 23 mg/dL Final   Creatinine, Ser 04/06/2022 0.94  0.61 - 1.24 mg/dL Final   Calcium 88/90/7976 9.4  8.9 - 10.3 mg/dL Final   GFR, Estimated 04/06/2022 >60  >60 mL/min Final   Anion gap 04/06/2022 8  5 - 15 Final   WBC 04/06/2022 5.0  4.0 - 10.5 K/uL Final   RBC 04/06/2022 4.92  4.22 - 5.81 MIL/uL Final   Hemoglobin 04/06/2022 15.9  13.0 - 17.0 g/dL Final   HCT 88/90/7976 46.0  39.0 - 52.0 % Final   MCV 04/06/2022 93.5  80.0 - 100.0 fL Final   MCH 04/06/2022 32.3  26.0 - 34.0 pg Final   MCHC 04/06/2022 34.6  30.0 - 36.0 g/dL Final   RDW 88/90/7976 12.4  11.5 - 15.5 % Final   Platelets 04/06/2022 196  150 - 400 K/uL Final   nRBC 04/06/2022 0.0  0.0 - 0.2 % Final   Troponin I (High Sensitivity) 04/06/2022 5  <  18 ng/L Final   Troponin I (High Sensitivity) 04/06/2022 5  <18 ng/L Final  Admission on 03/14/2022, Discharged on 03/14/2022  Component Date Value Ref Range Status   Sodium 03/14/2022 141  135 - 145 mmol/L Final   Potassium 03/14/2022 4.1  3.5 - 5.1 mmol/L Final   Chloride 03/14/2022 107  98 - 111 mmol/L Final   CO2 03/14/2022 28  22 - 32 mmol/L Final   Glucose, Bld 03/14/2022 98  70 - 99 mg/dL Final   BUN 89/82/7976 18  8 - 23 mg/dL Final   Creatinine, Ser 03/14/2022 0.81  0.61 - 1.24 mg/dL Final   Calcium 89/82/7976 9.3  8.9 - 10.3 mg/dL Final   GFR, Estimated 03/14/2022 >60  >60 mL/min Final   Anion gap 03/14/2022 6  5 - 15  Final   WBC 03/14/2022 5.6  4.0 - 10.5 K/uL Final   RBC 03/14/2022 4.98  4.22 - 5.81 MIL/uL Final   Hemoglobin 03/14/2022 15.9  13.0 - 17.0 g/dL Final   HCT 89/82/7976 46.1  39.0 - 52.0 % Final   MCV 03/14/2022 92.6  80.0 - 100.0 fL Final   MCH 03/14/2022 31.9  26.0 - 34.0 pg Final   MCHC 03/14/2022 34.5  30.0 - 36.0 g/dL Final   RDW 89/82/7976 11.9  11.5 - 15.5 % Final   Platelets 03/14/2022 175  150 - 400 K/uL Final   nRBC 03/14/2022 0.0  0.0 - 0.2 % Final   Troponin I (High Sensitivity) 03/14/2022 5  <18 ng/L Final   Troponin I (High Sensitivity) 03/14/2022 5  <18 ng/L Final  No image results found. No results found.       ASSESSMENT & PLAN   Assessment & Plan Suspected sleep apnea Suspected sleep apnea   Symptoms suggestive of sleep apnea include chronic insomnia and neck stiffness upon waking. He has not been previously evaluated for sleep apnea. Refer for a sleep study to evaluate for sleep apnea. Discuss potential treatments, including CPAP, if diagnosis is confirmed. Other insomnia Chronic insomnia has persisted for two years with worsening symptoms, potentially linked to suspected sleep apnea. The current regimen of mirtazapine  and trazodone  is ineffective, with mirtazapine  at a higher than recommended dose for sleep. Seroquel is not advised due to potential long-term side effects. Sleep apnea has not been evaluated. Reduce mirtazapine  dose from 45 mg to 15 mg to improve sleep efficacy. Prescribe trazodone  100 mg as needed for sleep, recommending experimentation with less frequent use for potentially better results. Encourage use of [employer] for education on insomnia treatment options. Refer for sleep apnea evaluation to confirm diagnosis and discuss potential treatments, including CPAP. Recommend a memory foam pillow to improve sleep quality. Schedule a follow-up appointment in one month to assess treatment efficacy and adjust as needed. Encourage use of MyChart for interim  communication and support. Epigastric heaviness Gastrointestinal issues present with stomach pain worse in the morning, improving by afternoon. He has an upcoming gastroenterologist consultation on the 16th. Attend the scheduled GI consultation and discuss the possibility of endoscopy and colonoscopy with the gastroenterologist.   ORDER ASSOCIATIONS  #   DIAGNOSIS / CONDITION ICD-10 ENCOUNTER ORDER     ICD-10-CM   1. Suspected sleep apnea  R29.818 traZODone  (DESYREL ) 100 MG tablet    mirtazapine  (REMERON ) 15 MG tablet    Ambulatory referral to Sleep Studies    2. Other insomnia  G47.09 traZODone  (DESYREL ) 100 MG tablet    mirtazapine  (REMERON ) 15 MG tablet  Ambulatory referral to Sleep Studies    3. Epigastric heaviness  R19.8          Orders Placed in Encounter:    Referral Orders         Ambulatory referral to Sleep Studies     Meds ordered this encounter  Medications   traZODone  (DESYREL ) 100 MG tablet    Sig: Take 1 tablet (100 mg total) by mouth at bedtime. Take intermittently only as needed.    Dispense:  90 tablet    Refill:  3   mirtazapine  (REMERON ) 15 MG tablet    Sig: Take 1 tablet (15 mg total) by mouth at bedtime. Replaces 45 mg dose    Dispense:  90 tablet    Refill:  3         This document was synthesized by artificial intelligence ([employer]) using HIPAA-compliant recording of the clinical interaction;   We discussed the use of AI scribe software for clinical note transcription with the patient, who gave verbal consent to proceed. additional Info: This encounter employed state-of-the-art, real-time, collaborative documentation. The patient actively reviewed and assisted in updating their electronic medical record on a shared screen, ensuring transparency and facilitating joint problem-solving for the problem list, overview, and plan. This approach promotes accurate, informed care. The treatment plan was discussed and reviewed in detail, including medication  safety, potential side effects, and all patient questions. We confirmed understanding and comfort with the plan. Follow-up instructions were established, including contacting the office for any concerns, returning if symptoms worsen, persist, or new symptoms develop, and precautions for potential emergency department visits.

## 2024-02-21 NOTE — Assessment & Plan Note (Signed)
 Chronic insomnia has persisted for two years with worsening symptoms, potentially linked to suspected sleep apnea. The current regimen of mirtazapine  and trazodone  is ineffective, with mirtazapine  at a higher than recommended dose for sleep. Seroquel is not advised due to potential long-term side effects. Sleep apnea has not been evaluated. Reduce mirtazapine  dose from 45 mg to 15 mg to improve sleep efficacy. Prescribe trazodone  100 mg as needed for sleep, recommending experimentation with less frequent use for potentially better results. Encourage use of [employer] for education on insomnia treatment options. Refer for sleep apnea evaluation to confirm diagnosis and discuss potential treatments, including CPAP. Recommend a memory foam pillow to improve sleep quality. Schedule a follow-up appointment in one month to assess treatment efficacy and adjust as needed. Encourage use of MyChart for interim communication and support.

## 2024-02-21 NOTE — Assessment & Plan Note (Signed)
 Suspected sleep apnea   Symptoms suggestive of sleep apnea include chronic insomnia and neck stiffness upon waking. He has not been previously evaluated for sleep apnea. Refer for a sleep study to evaluate for sleep apnea. Discuss potential treatments, including CPAP, if diagnosis is confirmed.

## 2024-03-08 NOTE — Addendum Note (Signed)
 Addended by: Gracelin Weisberg G on: 03/08/2024 09:38 AM   Modules accepted: Orders

## 2024-03-11 ENCOUNTER — Ambulatory Visit: Payer: Self-pay | Admitting: Internal Medicine

## 2024-03-11 MED ORDER — ONDANSETRON 4 MG PO TBDP: 4.0000 mg | ORAL_TABLET | Freq: Three times a day (TID) | ORAL | 2 refills | Status: DC | PRN

## 2024-03-11 NOTE — Progress Notes (Signed)
 Ahi 18.3 moderate severe obstructive sleep apnea Include in glp-1 prior authorization; forward with Zepbound 2.5 mg weekly prescription.

## 2024-03-11 NOTE — Addendum Note (Signed)
 Addended by: Shirl Ludington G on: 03/11/2024 06:30 AM   Modules accepted: Orders

## 2024-03-13 ENCOUNTER — Ambulatory Visit (INDEPENDENT_AMBULATORY_CARE_PROVIDER_SITE_OTHER): Payer: Medicare (Managed Care)

## 2024-03-13 VITALS — Ht 71.0 in | Wt 230.0 lb

## 2024-03-13 DIAGNOSIS — Z0000 Encounter for general adult medical examination without abnormal findings: Secondary | ICD-10-CM

## 2024-03-13 DIAGNOSIS — Z1211 Encounter for screening for malignant neoplasm of colon: Secondary | ICD-10-CM

## 2024-03-13 NOTE — Progress Notes (Addendum)
 Subjective:   Lawrence Carlson is a 68 y.o. who presents for a Medicare Wellness preventive visit.  As a reminder, Annual Wellness Visits don't include a physical exam, and some assessments may be limited, especially if this visit is performed virtually. We may recommend an in-person follow-up visit with your provider if needed.  Visit Complete: Virtual I connected with  Lorrene Blanco on 03/13/24 by a audio enabled telemedicine application and verified that I am speaking with the correct person using two identifiers.  Patient Location: Home  Provider Location: Office/Clinic  I discussed the limitations of evaluation and management by telemedicine. The patient expressed understanding and agreed to proceed.  Vital Signs: Because this visit was a virtual/telehealth visit, some criteria may be missing or patient reported. Any vitals not documented were not able to be obtained and vitals that have been documented are patient reported.  VideoDeclined- This patient declined Librarian, academic. Therefore the visit was completed with audio only.  Persons Participating in Visit: Patient.  AWV Questionnaire: No: Patient Medicare AWV questionnaire was not completed prior to this visit.  Cardiac Risk Factors include: advanced age (>72men, >70 women);obesity (BMI >30kg/m2);male gender;dyslipidemia     Objective:    Today's Vitals   03/13/24 1429  Weight: 230 lb (104.3 kg)  Height: 5' 11 (1.803 m)   Body mass index is 32.08 kg/m.     03/13/2024    2:39 PM 06/16/2022    3:42 PM 05/29/2022    4:10 PM 05/15/2022    2:17 PM 04/06/2022   12:06 PM 03/14/2022    2:26 PM 11/08/2021    1:32 AM  Advanced Directives  Does Patient Have a Medical Advance Directive? Yes No No No No No No  Type of Estate agent of [place];Living will        Copy of Healthcare Power of Attorney in Chart? No - copy requested        Would patient like information on creating  a medical advance directive?   No - Patient declined        Current Medications (verified) Outpatient Encounter Medications as of 03/13/2024  Medication Sig   alum & mag hydroxide-simeth (MAALOX MAX) 400-400-40 MG/5ML suspension Take 10 mLs by mouth every 6 (six) hours as needed for indigestion.   atorvastatin (LIPITOR) 10 MG tablet Take 10 mg by mouth daily.   Cyanocobalamin  (B-12 PO) Take 1 tablet by mouth daily.   esomeprazole  (NEXIUM ) 40 MG capsule Take 1 capsule (40 mg total) by mouth daily.   gabapentin (NEURONTIN) 300 MG capsule 600 mg 3 (three) times daily.   LORazepam  (ATIVAN ) 1 MG tablet TAKE ONE TABLET BY MOUTH EVERY 8 HOURS   mirtazapine  (REMERON ) 15 MG tablet Take 1 tablet (15 mg total) by mouth at bedtime. Replaces 45 mg dose   ondansetron  (ZOFRAN -ODT) 4 MG disintegrating tablet Take 1 tablet (4 mg total) by mouth every 8 (eight) hours as needed for nausea or vomiting.   QUEtiapine (SEROQUEL) 100 MG tablet Take 100 mg by mouth 2 (two) times daily.   traZODone  (DESYREL ) 100 MG tablet TAKE ONE AND ONE-HALF TABLETS BY MOUTH AT BEDTIME   OLANZapine  (ZYPREXA ) 10 MG tablet TAKE ONE AND ONE-HALF TABLETS (15 MG TOTAL) BY MOUTH DAILY (Patient not taking: Reported on 03/13/2024)   Vilazodone  HCl 20 MG TABS Take 1 tablet (20 mg total) by mouth daily after breakfast. (Patient not taking: Reported on 03/13/2024)   [DISCONTINUED] esomeprazole  (NEXIUM ) 40 MG capsule Take 1  capsule (40 mg total) by mouth at bedtime. Replaces pantoprazole   [DISCONTINUED] mirtazapine  (REMERON ) 45 MG tablet Take 45 mg by mouth at bedtime.   [DISCONTINUED] ondansetron  (ZOFRAN ) 4 MG tablet Take 1 tablet (4 mg total) by mouth every 8 (eight) hours as needed for nausea or vomiting.   [DISCONTINUED] traZODone  (DESYREL ) 100 MG tablet Take 1 tablet (100 mg total) by mouth at bedtime. Take intermittently only as needed.   No facility-administered encounter medications on file as of 03/13/2024.    Allergies  (verified) Patient has no known allergies.   History: Past Medical History:  Diagnosis Date   Aggressive behavior 09/15/2020   Depression    DVT (deep venous thrombosis) (HCC)    Paralyzed vocal cords    Personal history of DVT (deep vein thrombosis) 10/15/2012   Overview:   Left leg in 2011  Left leg in 2011     Pulmonary embolism (HCC)    Sore neck 03/22/2012   TIA due to embolism [facility])    Past Surgical History:  Procedure Laterality Date   APPENDECTOMY     KNEE ARTHROPLASTY Left 06/29/2021   Procedure: COMPUTER ASSISTED TOTAL KNEE ARTHROPLASTY;  Surgeon: Fidel Rogue, MD;  Location: [facility];  Service: Orthopedics;  Laterality: Left;   THROAT SURGERY     Family History  Problem Relation Age of Onset   Alcohol  abuse Mother    Alcohol  abuse Father    Social History   Socioeconomic History   Marital status: Married    Spouse name: Not on file   Number of children: 2   Years of education: 14   Highest education level: Not on file  Occupational History   Occupation: Airline pilot    Comment: self-employed  Tobacco Use   Smoking status: Never   Smokeless tobacco: Never  Vaping Use   Vaping status: Never Used  Substance and Sexual Activity   Alcohol  use: Never   Drug use: No   Sexual activity: Not Currently    Comment: reports no sex in 18 years  Other Topics Concern   Not on file  Social History Narrative   Lives at home with wife in [place]. Likes to play golf, run, and play golf.   Social Drivers of Corporate investment banker Strain: Low Risk  (03/13/2024)   Overall Financial Resource Strain (CARDIA)    Difficulty of Paying Living Expenses: Not hard at all  Food Insecurity: No Food Insecurity (03/13/2024)   Hunger Vital Sign    Worried About Running Out of Food in the Last Year: Never true    Ran Out of Food in the Last Year: Never true  Transportation Needs: No Transportation Needs (03/13/2024)   PRAPARE - Administrator, Civil Service (Medical):  No    Lack of Transportation (Non-Medical): No  Physical Activity: Insufficiently Active (03/13/2024)   Exercise Vital Sign    Days of Exercise per Week: 4 days    Minutes of Exercise per Session: 30 min  Stress: No Stress Concern Present (03/13/2024)   [employer] of Occupational Health - Occupational Stress Questionnaire    Feeling of Stress: Only a little  Social Connections: Moderately Isolated (03/13/2024)   Social Connection and Isolation Panel    Frequency of Communication with Friends and Family: Once a week    Frequency of Social Gatherings with Friends and Family: More than three times a week    Attends Religious Services: Never    Database administrator or Organizations:  No    Attends Banker Meetings: Never    Marital Status: Married    Tobacco Counseling Counseling given: Not Answered    Clinical Intake:  Pre-visit preparation completed: Yes  Pain : 0-10 Pain Type: Chronic pain Pain Location: Ankle Pain Orientation: Left Pain Descriptors / Indicators: Nagging Pain Onset: More than a month ago Pain Frequency: Intermittent     BMI - recorded: 32.08 Nutritional Status: BMI > 30  Obese Nutritional Risks: None Diabetes: No  No results found for: HGBA1C   How often do you need to have someone help you when you read instructions, pamphlets, or other written materials from your doctor or pharmacy?: 1 - Never  Interpreter Needed?: No  Information entered by :: Ellouise Haws, LPN   Activities of Daily Living     03/13/2024    2:31 PM  In your present state of health, do you have any difficulty performing the following activities:  Hearing? 0  Vision? 0  Difficulty concentrating or making decisions? 0  Walking or climbing stairs? 0  Dressing or bathing? 0  Doing errands, shopping? 0  Preparing Food and eating ? N  Using the Toilet? N  In the past six months, have you accidently leaked urine? N  Do you have problems with loss of  bowel control? N  Managing your Medications? N  Managing your Finances? N  Housekeeping or managing your Housekeeping? N    Patient Care Team: Jesus Bernardino MATSU, MD as PCP - General (Internal Medicine)  I have updated your Care Teams any recent Medical Services you may have received from other providers in the past year.     Assessment:   This is a routine wellness examination for [place].  Hearing/Vision screen Hearing Screening - Comments:: Pt denies any hearing issues  Vision Screening - Comments:: Encouraged to follow up with eye provider    Goals Addressed             This Visit's Progress    Patient Stated       Weight loss        Depression Screen     03/13/2024    2:40 PM 02/04/2024   10:53 AM  PHQ 2/9 Scores  PHQ - 2 Score 0 0    Fall Risk     03/13/2024    2:41 PM 02/04/2024   10:53 AM  Fall Risk   Falls in the past year? 1 0  Number falls in past yr: 0 0  Injury with Fall? 1 0  Comment scratches   Risk for fall due to : History of fall(s);Impaired balance/gait No Fall Risks  Follow up Falls prevention discussed Falls evaluation completed    MEDICARE RISK AT HOME:  Medicare Risk at Home Any stairs in or around the home?: Yes If so, are there any without handrails?: No Home free of loose throw rugs in walkways, pet beds, electrical cords, etc?: Yes Adequate lighting in your home to reduce risk of falls?: Yes Life alert?: No Use of a cane, walker or w/c?: No Grab bars in the bathroom?: No Shower chair or bench in shower?: No Elevated toilet seat or a handicapped toilet?: No  TIMED UP AND GO:  Was the test performed?  No  Cognitive Function: 6CIT completed        03/13/2024    2:42 PM  6CIT Screen  What Year? 0 points  What month? 0 points  What time? 0 points  Count back from  20 0 points  Months in reverse 0 points  Repeat phrase 0 points  Total Score 0 points    Immunizations Immunization History  Administered Date(s)  Administered   INFLUENZA, HIGH DOSE SEASONAL PF 02/21/2024   Influenza,inj,Quad PF,6+ Mos 07/11/2016, 02/06/2017, 02/27/2018, 02/11/2023   Influenza-Unspecified 05/05/2009, 03/15/2012, 07/11/2016, 02/06/2017, 02/27/2018   Pneumococcal Polysaccharide-23 02/19/2020    Screening Tests Health Maintenance  Topic Date Due   COVID-19 Vaccine (1) Never done   Hepatitis C Screening  Never done   DTaP/Tdap/Td (1 - Tdap) Never done   Zoster Vaccines- Shingrix (1 of 2) Never done   Colonoscopy  Never done   Pneumococcal Vaccine: 50+ Years (2 of 2 - PCV) 02/18/2021   Medicare Annual Wellness (AWV)  03/13/2025   Influenza Vaccine  Completed   Meningococcal B Vaccine  Aged Out    Health Maintenance Items Addressed: See Nurse Notes at the end of this note  Additional Screening:  Vision Screening: Recommended annual ophthalmology exams for early detection of glaucoma and other disorders of the eye. Is the patient up to date with their annual eye exam?  No  Who is the provider or what is the name of the office in which the patient attends annual eye exams? Encouraged to follow up with provider   Dental Screening: Recommended annual dental exams for proper oral hygiene  Community Resource Referral / Chronic Care Management: CRR required this visit?  No   CCM required this visit?  No   Plan:    I have personally reviewed and noted the following in the patient's chart:   Medical and social history Use of alcohol , tobacco or illicit drugs  Current medications and supplements including opioid prescriptions. Patient is not currently taking opioid prescriptions. Functional ability and status Nutritional status Physical activity Advanced directives List of other physicians Hospitalizations, surgeries, and ER visits in previous 12 months Vitals Screenings to include cognitive, depression, and falls Referrals and appointments  In addition, I have reviewed and discussed with patient  certain preventive protocols, quality metrics, and best practice recommendations. A written personalized care plan for preventive services as well as general preventive health recommendations were provided to patient.   Ellouise VEAR Haws, LPN   [id number]   After Visit Summary: (MyChart) Due to this being a telephonic visit, the after visit summary with patients personalized plan was offered to patient via MyChart   Notes: Nothing significant to report at this time.

## 2024-03-13 NOTE — Patient Instructions (Signed)
 [name],  Thank you for taking the time for your Medicare Wellness Visit. I appreciate your continued commitment to your health goals. Please review the care plan we discussed, and feel free to reach out if I can assist you further.  Medicare recommends these wellness visits once per year to help you and your care team stay ahead of potential health issues. These visits are designed to focus on prevention, allowing your provider to concentrate on managing your acute and chronic conditions during your regular appointments.  Please note that Annual Wellness Visits do not include a physical exam. Some assessments may be limited, especially if the visit was conducted virtually. If needed, we may recommend a separate in-person follow-up with your provider.  Ongoing Care Seeing your primary care provider every 3 to 6 months helps us  monitor your health and provide consistent, personalized care.   Referrals If a referral was made during today's visit and you haven't received any updates within two weeks, please contact the referred provider directly to check on the status.  Recommended Screenings:  Health Maintenance  Topic Date Due   Medicare Annual Wellness Visit  Never done   COVID-19 Vaccine (1) Never done   Hepatitis C Screening  Never done   DTaP/Tdap/Td vaccine (1 - Tdap) Never done   Zoster (Shingles) Vaccine (1 of 2) Never done   Colon Cancer Screening  Never done   Pneumococcal Vaccine for age over 92 (2 of 2 - PCV) 02/18/2021   Flu Shot  Completed   Meningitis B Vaccine  Aged Out       06/16/2022    3:42 PM  Advanced Directives  Does Patient Have a Medical Advance Directive? No   Advance Care Planning is important because it: Ensures you receive medical care that aligns with your values, goals, and preferences. Provides guidance to your family and loved ones, reducing the emotional burden of decision-making during critical moments.  Vision: Annual vision screenings are  recommended for early detection of glaucoma, cataracts, and diabetic retinopathy. These exams can also reveal signs of chronic conditions such as diabetes and high blood pressure.  Dental: Annual dental screenings help detect early signs of oral cancer, gum disease, and other conditions linked to overall health, including heart disease and diabetes.  Please see the attached documents for additional preventive care recommendations.

## 2024-03-17 ENCOUNTER — Telehealth: Payer: Self-pay

## 2024-03-17 ENCOUNTER — Other Ambulatory Visit (HOSPITAL_COMMUNITY): Payer: Self-pay

## 2024-03-17 NOTE — Telephone Encounter (Signed)
 Pharmacy Patient Advocate Encounter   Received notification from Patient Advice Request messages that prior authorization for Zepbound 2.5MG /0.5ML Auto-injectors is required/requested.   Insurance verification completed.   The patient is insured through [employer].   Per test claim: PA required; PA submitted to above mentioned insurance via Latent Key/confirmation #/EOC [facility] Status is pending

## 2024-03-18 ENCOUNTER — Other Ambulatory Visit (HOSPITAL_COMMUNITY): Payer: Self-pay

## 2024-03-18 NOTE — Telephone Encounter (Signed)
 Pharmacy Patient Advocate Encounter  Received notification from [employer] that Prior Authorization for Zepbound 2.5MG /0.5ML pen-injectors has been APPROVED from 02/16/24 to 03/18/25. Ran test claim, Copay is $317.06. This test claim was processed through [facility]- copay amounts may vary at other pharmacies due to pharmacy/plan contracts, or as the patient moves through the different stages of their insurance plan.   PA #/Case ID/Reference #: [id number]

## 2024-03-19 DIAGNOSIS — G4733 Obstructive sleep apnea (adult) (pediatric): Secondary | ICD-10-CM | POA: Insufficient documentation

## 2024-03-24 ENCOUNTER — Other Ambulatory Visit: Payer: Self-pay

## 2024-03-24 DIAGNOSIS — R110 Nausea: Secondary | ICD-10-CM

## 2024-03-24 MED ORDER — ONDANSETRON 4 MG PO TBDP: 4.0000 mg | ORAL_TABLET | Freq: Three times a day (TID) | ORAL | 2 refills | Status: AC | PRN

## 2024-03-26 ENCOUNTER — Ambulatory Visit: Payer: Medicare (Managed Care) | Admitting: Internal Medicine

## 2024-04-16 ENCOUNTER — Encounter: Payer: Self-pay | Admitting: Internal Medicine

## 2024-04-16 ENCOUNTER — Ambulatory Visit: Payer: Medicare (Managed Care) | Admitting: Internal Medicine

## 2024-04-16 VITALS — BP 100/70 | HR 72 | Temp 98.0°F | Ht 71.0 in | Wt 232.2 lb

## 2024-04-16 DIAGNOSIS — F0781 Postconcussional syndrome: Secondary | ICD-10-CM

## 2024-04-16 DIAGNOSIS — Z713 Dietary counseling and surveillance: Secondary | ICD-10-CM | POA: Diagnosis not present

## 2024-04-16 DIAGNOSIS — F319 Bipolar disorder, unspecified: Secondary | ICD-10-CM | POA: Diagnosis not present

## 2024-04-16 DIAGNOSIS — R5383 Other fatigue: Secondary | ICD-10-CM

## 2024-04-16 DIAGNOSIS — Z87820 Personal history of traumatic brain injury: Secondary | ICD-10-CM

## 2024-04-16 LAB — VITAMIN B12: Vitamin B-12: 340 pg/mL (ref 211–911)

## 2024-04-16 LAB — SEDIMENTATION RATE: Sed Rate: 3 mm/h (ref 0–20)

## 2024-04-16 LAB — VITAMIN D 25 HYDROXY (VIT D DEFICIENCY, FRACTURES): VITD: 22.74 ng/mL — ABNORMAL LOW (ref 30.00–100.00)

## 2024-04-16 LAB — TESTOSTERONE: Testosterone: 251.79 ng/dL — ABNORMAL LOW (ref 300.00–890.00)

## 2024-04-16 NOTE — Patient Instructions (Signed)
  TRX and Suspension Trainers Feature Why It Matters  Weight capacity If it can't reliably take your weight + dynamic movement, it's a safety risk. Some good ones support 300-350 lbs. (Http://www.castillo-fisher.org/)  Good anchors Door anchor + suspension anchor (for beams/trees) + possibly wrap-around straps extend where you can use it. (Http://www.castillo-fisher.org/)  Durable straps & hardware Strong webbing, solid buckles/carabiners, metal adjusters where possible. Cheaper units tend to have weaker plastic parts. (Total Shape)  Handle comfort & foot cradles Handles should allow grip; foot cradles matter if you'll do lower-body or more dynamic moves. (loopsworld.com)  Easy adjustment & good strap markings Makes transitions smoother; helps ensure straps are of equal length. (Total Shape)  Warranty / support & extras A good brand usually gives a longer warranty or better customer service, and useful extras (travel bag, video guides, etc.) bring value. (Total Shape)   ?? Top Picks for Best Value Based on recent review roundups, here are some systems that are repeatedly identified as good value: System What Makes It Good Value / Pros Considerations  TRX Home2 System High quality build; includes door/suspension anchors; solid reputation. Often picked as "best overall" or "best value" among mid-to-upper-tier systems.  Price tends to be higher than generic straps. Handles are foam (comfortable but less long-lasting than rubber/textured).   Lifeline [employer]on allows for wide/narrow/neutral angles; good versatility; fairly comfortable handles.  Some of the parts (foot cradles or smaller anchor hardware) may be less premium. Strap-length adjusters etc. not always as high end.  NOSSK Twin PRO / simpler brands Often cheaper; good for beginners; many users find NOSSK gives nearly TRX-level functionality for less money.  May cut cost in comfort, grip, or anchoring options; may have less durable materials; fewer extras.  GoFit  Gravity Straps Highly adjustable, decent capacity; good for outdoor / travel.  Sometimes lacking in strap length markings or premium hardware; anchors might be more basic.   ?? What "Best Value" Looks Like by Price Tier To help you pick, here's roughly what to expect at different price levels ([place]  market): Price Range What You Get Good Value Examples  $30-$60 Basic straps, maybe one door anchor, simpler handles, lighter duty hardware. Decent for casual / travel use. Some NOSSK or generic brands; often what reviewers call "budget" options.  $60-$120 Much better build, more anchoring options, better handles/cradles, extras (video guides, travel bag, etc.). TRX Home2, [employer]montaigne, good mid-tier brands.  $120+ [employer], lots of accessories, long warranties, often used in gyms or by serious users. TRX Pro or similar.

## 2024-04-16 NOTE — Progress Notes (Signed)
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 pain/mobility; optimize management of OA/gout/DJD. Educate patient on medication safety, red flags, and when to seek urgent care. Schedule follow-up in 1 month; interim contact via MyChart as needed.  Use this agenda and outline to guide the visit, focusing on sleep disorder management, medication safety, and overall risk reduction.    ORDER ASSOCIATIONS  #   DIAGNOSIS / CONDITION ICD-10 ENCOUNTER ORDER  No diagnosis found.       Orders Placed in Encounter:   Lab Orders  No laboratory test(s) ordered today   Imaging Orders  No imaging studies ordered today   Referral Orders  No referral(s) requested today   No orders of the defined types were placed in this encounter.   No orders of the defined types were placed in this encounter. ED Discharge Orders     None         This document was synthesized by artificial intelligence ([employer]bridge) using HIPAA-compliant recording of the clinical interaction;   We discussed the use of AI scribe  software for clinical note transcription with the patient, who gave verbal consent to proceed. additional Info: This encounter employed state-of-the-art, real-time, collaborative documentation. The patient actively reviewed and assisted in updating their electronic medical record on a shared screen, ensuring transparency and facilitating joint problem-solving for the problem list, overview, and plan. This approach promotes accurate, informed care. The treatment plan was discussed and reviewed in detail, including medication safety, potential side effects, and all patient questions. We confirmed understanding and comfort with the plan. Follow-up instructions were established, including contacting the office for any concerns, returning if symptoms worsen, persist, or new symptoms develop, and precautions for potential emergency department visits.

## 2024-04-17 ENCOUNTER — Other Ambulatory Visit (INDEPENDENT_AMBULATORY_CARE_PROVIDER_SITE_OTHER): Payer: Medicare (Managed Care)

## 2024-04-17 DIAGNOSIS — Z713 Dietary counseling and surveillance: Secondary | ICD-10-CM

## 2024-04-17 DIAGNOSIS — F0781 Postconcussional syndrome: Secondary | ICD-10-CM | POA: Insufficient documentation

## 2024-04-17 DIAGNOSIS — R5383 Other fatigue: Secondary | ICD-10-CM | POA: Insufficient documentation

## 2024-04-17 DIAGNOSIS — F319 Bipolar disorder, unspecified: Secondary | ICD-10-CM | POA: Insufficient documentation

## 2024-04-17 DIAGNOSIS — Z87820 Personal history of traumatic brain injury: Secondary | ICD-10-CM | POA: Insufficient documentation

## 2024-04-17 LAB — URINALYSIS, ROUTINE W REFLEX MICROSCOPIC
Bilirubin Urine: NEGATIVE
Hgb urine dipstick: NEGATIVE
Ketones, ur: NEGATIVE
Leukocytes,Ua: NEGATIVE
Nitrite: NEGATIVE
RBC / HPF: NONE SEEN (ref 0–?)
Specific Gravity, Urine: 1.015 (ref 1.000–1.030)
Total Protein, Urine: NEGATIVE
Urine Glucose: NEGATIVE
Urobilinogen, UA: 0.2 (ref 0.0–1.0)
pH: 5.5 (ref 5.0–8.0)
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
## 2024-04-18 ENCOUNTER — Encounter: Payer: Self-pay | Admitting: Internal Medicine

## 2024-04-18 ENCOUNTER — Telehealth: Payer: Self-pay

## 2024-04-18 ENCOUNTER — Ambulatory Visit: Payer: Self-pay | Admitting: Internal Medicine

## 2024-04-18 NOTE — Telephone Encounter (Signed)
 Copied from CRM [id number]. Topic: Clinical - Request for Lab/Test Order >> Apr 18, 2024  2:30 PM Alfonso HERO wrote: Patient calling asking for a callback to discuss results  Sent pt message via my chart provider has not reviewed yet soon as he does I will call pt or review labs via my chart when provider reviews.

## 2024-04-20 NOTE — Progress Notes (Signed)
 Arrange 8-10 am testosterone  repeat.   Inform the patient I think 5000 units of D3 is the sweet spot for him for now for supplement, but I think the fatigue is coming from multiple things and its going to take time to improve it.   Try to have a follow up appointment within a week or two of the testosterone  recheck, if possible.

## 2024-04-21 ENCOUNTER — Other Ambulatory Visit: Payer: Self-pay

## 2024-04-21 ENCOUNTER — Other Ambulatory Visit (INDEPENDENT_AMBULATORY_CARE_PROVIDER_SITE_OTHER): Payer: Medicare (Managed Care)

## 2024-04-21 DIAGNOSIS — R5383 Other fatigue: Secondary | ICD-10-CM

## 2024-04-21 LAB — VITAMIN D 25 HYDROXY (VIT D DEFICIENCY, FRACTURES): VITD: 29.86 ng/mL — ABNORMAL LOW (ref 30.00–100.00)

## 2024-04-21 LAB — VITAMIN B12: Vitamin B-12: 600 pg/mL (ref 211–911)

## 2024-04-21 NOTE — Telephone Encounter (Signed)
 Read by Lorrene Blanco at 4:20PM on 04/20/2024. Read by Odella Blanco Souther at 10:53AM on 04/20/2024.

## 2024-04-22 ENCOUNTER — Telehealth: Payer: Self-pay

## 2024-04-22 NOTE — Telephone Encounter (Signed)
 Spoke with pt he never seen his levels Testerone levels I let him know we are waiting on the Testerone free levels to come back if have not had call by tomorrow to call office back.I explain to him the regular levels are back but the free is not.

## 2024-04-22 NOTE — Progress Notes (Signed)
  Lab Results  Component Value Date   TESTOSTERONE  423 04/21/2024   TESTOSTERONE  251.79 (L) 04/16/2024  Low level was not an 8-10 am draw.

## 2024-04-23 LAB — TESTOSTERONE,FREE AND TOTAL
Testosterone, Free: 2.6 pg/mL — AB (ref 6.6–18.1)
Testosterone: 423 ng/dL (ref 264–916)

## 2024-04-24 LAB — IRON,TIBC AND FERRITIN PANEL
%SAT: 36 % (ref 20–48)
Ferritin: 44 ng/mL (ref 24–380)
Iron: 128 ug/dL (ref 50–180)
TIBC: 353 ug/dL (ref 250–425)

## 2024-04-24 LAB — VITAMIN A: Vitamin A (Retinoic Acid): 52 ug/dL (ref 38–98)

## 2024-04-24 LAB — VITAMIN E
Gamma-Tocopherol (Vit E): 1.5 mg/L (ref <4.4)
Vitamin E (Alpha Tocopherol): 12.9 mg/L (ref 5.7–19.9)

## 2024-04-24 LAB — VITAMIN B3
Nicotinamide: <20 ng/mL
Nicotinic Acid: <20 ng/mL

## 2024-04-24 LAB — ZINC: Zinc: 63 ug/dL (ref 60–130)

## 2024-04-24 LAB — VITAMIN B1: Vitamin B1 (Thiamine): 12 nmol/L (ref 8–30)

## 2024-04-24 LAB — VITAMIN B6: Vitamin B6: 11.4 ng/mL (ref 2.1–21.7)

## 2024-04-24 LAB — VITAMIN K1, SERUM: Vitamin K: 1290 pg/mL (ref 130–1500)

## 2024-04-29 ENCOUNTER — Other Ambulatory Visit: Payer: Self-pay

## 2024-04-29 ENCOUNTER — Other Ambulatory Visit (INDEPENDENT_AMBULATORY_CARE_PROVIDER_SITE_OTHER): Payer: Medicare (Managed Care)

## 2024-04-29 DIAGNOSIS — R5383 Other fatigue: Secondary | ICD-10-CM

## 2024-04-29 LAB — VITAMIN D 25 HYDROXY (VIT D DEFICIENCY, FRACTURES): VITD: 59.63 ng/mL (ref 30.00–100.00)

## 2024-04-30 ENCOUNTER — Ambulatory Visit: Payer: Self-pay | Admitting: Internal Medicine

## 2024-04-30 LAB — TESTOSTERONE,FREE AND TOTAL
Testosterone, Free: 6.1 pg/mL — ABNORMAL LOW (ref 6.6–18.1)
Testosterone: 624 ng/dL (ref 264–916)

## 2024-05-19 ENCOUNTER — Ambulatory Visit: Payer: Medicare (Managed Care) | Admitting: Internal Medicine

## 2024-05-19 ENCOUNTER — Encounter (INDEPENDENT_AMBULATORY_CARE_PROVIDER_SITE_OTHER): Payer: Self-pay

## 2024-05-19 ENCOUNTER — Encounter: Payer: Self-pay | Admitting: Internal Medicine

## 2024-05-19 ENCOUNTER — Telehealth: Payer: Self-pay

## 2024-05-19 VITALS — BP 110/72 | HR 77 | Temp 97.8°F | Ht 71.0 in | Wt 224.2 lb

## 2024-05-19 DIAGNOSIS — R0989 Other specified symptoms and signs involving the circulatory and respiratory systems: Secondary | ICD-10-CM

## 2024-05-19 DIAGNOSIS — G4733 Obstructive sleep apnea (adult) (pediatric): Secondary | ICD-10-CM

## 2024-05-19 DIAGNOSIS — E291 Testicular hypofunction: Secondary | ICD-10-CM
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
## 2024-05-19 NOTE — Telephone Encounter (Signed)
 Copied from CRM [id number]. Topic: Clinical - Order For Equipment >> May 19, 2024  2:04 PM Dedra B wrote: Reason for CRM: Jada from [employer] called regarding pt's CPAP machine. They received a prescription but it doesn't;t state the min and max for settings. They need a proper prescription that states the settings faxed to [phone]. Pls call Jada at [phone] if any questions.  Review and advise next step so I can send to them thanks
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
## 2024-05-20 NOTE — Telephone Encounter (Signed)
 Faxed back to the number listed

## 2024-05-26 ENCOUNTER — Telehealth: Payer: Self-pay

## 2024-05-26 DIAGNOSIS — G4733 Obstructive sleep apnea (adult) (pediatric): Secondary | ICD-10-CM

## 2024-05-26 NOTE — Telephone Encounter (Signed)
 Tried to return call no answer left message for pt to call office back

## 2024-05-26 NOTE — Telephone Encounter (Signed)
 Copied from CRM #[id number]. Topic: General - Other >> May 26, 2024 12:46 PM Brittany M wrote: Reason for CRM: patient asking for Thedora Maire GRADE, CMA to call >> May 26, 2024  1:28 PM Dedra B wrote: Pt following up on request for a call from [place]. He did not disclose what it's regarding but said it's very important.  Spoke with pt stated that the order [facility] received is not for the machine just for the supplies for it needs new order. Also would like to know if they will s how him how to work it or would he have to come here because he has no clue on home to work the machine his first time having one. Advise pt I would send message to provider once provider reviews and advise ma would call him back. Pt states he is not getting no sleep and needs some sleep

## 2024-05-27 ENCOUNTER — Telehealth: Payer: Self-pay

## 2024-05-27 DIAGNOSIS — G4733 Obstructive sleep apnea (adult) (pediatric): Secondary | ICD-10-CM

## 2024-05-27 NOTE — Telephone Encounter (Signed)
 Spoke with pt states new order needed they are needing auto settings. I called [facility] spoke with them they stated needs settings please review and provider needs new order to be faxed over

## 2024-05-27 NOTE — Addendum Note (Signed)
 Addended by: Nasim Habeeb G on: 05/27/2024 01:49 PM   Modules accepted: Orders

## 2024-05-27 NOTE — Telephone Encounter (Signed)
 Printed and fax over new orders

## 2024-05-28 NOTE — Addendum Note (Signed)
 Addended by: Kaili Castille G on: 05/28/2024 10:21 AM   Modules accepted: Orders

## 2024-05-30 NOTE — Telephone Encounter (Signed)
 Faxed new order to [phone]

## 2024-06-16 ENCOUNTER — Telehealth: Payer: Self-pay

## 2024-06-16 NOTE — Telephone Encounter (Signed)
 Called and spoke with pt about receiving cpap machine stated he will get tomorrow.

## 2024-06-19 ENCOUNTER — Encounter: Payer: Self-pay | Admitting: Internal Medicine

## 2024-06-19 ENCOUNTER — Ambulatory Visit: Payer: Medicare (Managed Care) | Admitting: Internal Medicine

## 2024-06-19 VITALS — BP 138/78 | HR 84 | Temp 98.0°F | Ht 71.0 in | Wt 220.4 lb

## 2024-06-19 DIAGNOSIS — F5101 Primary insomnia: Secondary | ICD-10-CM | POA: Diagnosis not present

## 2024-06-19 DIAGNOSIS — Z79899 Other long term (current) drug therapy: Secondary | ICD-10-CM

## 2024-06-19 DIAGNOSIS — G4733 Obstructive sleep apnea (adult) (pediatric): Secondary | ICD-10-CM | POA: Diagnosis not present

## 2024-06-19 MED ORDER — ZOLPIDEM TARTRATE 5 MG PO TABS: 5.0000 mg | ORAL_TABLET | Freq: Every evening | ORAL | 0 refills | Status: AC | PRN
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
## 2024-06-19 NOTE — Patient Instructions (Addendum)
 Getting Started with Sleep Strategies While Waiting for CBT-I  You're making great progress by tolerating your CPAP machine for 1-2 hours while awake. This handout provides evidence-based strategies from cognitive behavioral therapy for insomnia (CBT-I) to help you continue building comfort with CPAP and improve your sleep until you can work with a CBT-I specialist. Understanding Your Progress The fact that you can now lie comfortably with CPAP for 1-2 hours is an important achievement. Gradual acclimation takes time, especially when anxiety is present. These strategies will help you continue building on this progress. Key Strategies to Practice Now Stimulus Control: Strengthen the Bed-Sleep Connection  Your bed should be strongly associated with sleep, not wakefulness or anxiety. Follow these rules consistently:  - Go to bed only when sleepy (not just tired or because it's bedtime)  - If you can't fall asleep within 15-20 minutes with CPAP on, get out of bed and go to another room  - Do something quiet and relaxing (reading, gentle stretching, listening to calm music) until you feel sleepy again  - Return to bed with CPAP only when you feel sleepy  - Wake up at the same time every morning, regardless of how much you slept  - Avoid daytime naps  - Use your bed only for sleep and intimacy--no TV, phone, reading, or worrying in bed  2. Sleep Restriction: Build Stronger Sleep Drive  This may seem counterintuitive, but limiting your time in bed actually improves sleep quality:  - Calculate your average actual sleep time (not time in bed)  - Set your time in bed to match your average sleep time plus 30 minutes  - Keep a fixed wake time every morning  - Once you're sleeping well (85% or more of your time in bed), gradually increase time in bed by 15-30 minutes  Example: If you typically sleep 5 hours but spend 8 hours in bed, limit your time in bed to 5.5 hours initially. This builds  sleep pressure and helps you fall asleep faster with CPAP.  3. Managing Anxiety About CPAP: Acceptance and Mindfulness  Instead of fighting anxious thoughts about CPAP, practice accepting them:  - Notice anxious thoughts without judgment: I'm having the thought that I can't sleep with this mask  - Remind yourself that thoughts are just thoughts, not facts  - Focus on your breathing: slow, deep breaths from your abdomen TRY BREATH IN COUNTING TO 4, HOLD IN FOR 7, THEN BREATHE OUT SLOWLY OVER 8.  - Practice mindfulness: observe sensations from the CPAP (air pressure, mask contact) with curiosity rather than resistance  - Remember your why: CPAP treats severe sleep apnea that affects your health and daytime function  4. Challenging Unhelpful Beliefs About Sleep  Common unhelpful beliefs that increase anxiety:  - I must get 8 hours of sleep or I'll be a disaster tomorrow ? Reality: Most people function well on 5-6 hours, and one poor night won't ruin you  - I'll never be able to sleep with this machine ? Reality: You're already tolerating it for 1-2 hours, which shows progress  - If I can't fall asleep right away, the whole night is ruined ? Reality: Sleep comes in waves; if you miss one wave, another will come  5. Sleep Hygiene Basics  While sleep hygiene alone isn't enough, these habits support the other strategies:  - Maintain a consistent sleep schedule 7 days per week  - Avoid caffeine after early afternoon  - Limit alcohol , especially before bed  -  Get regular exercise, but not within 6 hours of bedtime  - Keep your bedroom dark, quiet, and cool  - Avoid screens (phone, TV, computer) for 1 hour before bed  6. Gradual CPAP Acclimation  Continue building on your progress:  - Practice wearing CPAP during relaxing activities before bed (reading, watching TV)  - Use relaxation techniques while wearing CPAP: progressive muscle relaxation, deep breathing  -  Start with CPAP on at bedtime; if you wake and feel anxious, it's okay to remove it temporarily and try again  - Track small wins: even 3-4 hours of CPAP use provides significant health benefits  - Contact your sleep clinic if you have mask discomfort, air pressure issues, or nasal congestion--these are fixable problems What to Track  Keep a simple sleep diary noting:  - Time you got into bed  - Time you tried to fall asleep  - How long you wore CPAP  - Time you woke up  - How you felt during the day  This information will be valuable when you start formal CBT-I. Important Reminders   - Progress isn't linear--some nights will be better than others  - The goal is progress, not perfection  - These strategies work best when used together consistently  - Sleep restriction may cause temporary fatigue, but this builds the sleep drive needed to fall asleep with CPAP  - Continue taking your prescribed doxepin as directed When to Reach Out   Contact your provider if:  - You're not making progress after 2-3 weeks of consistent practice  - Anxiety about CPAP is worsening  - You have physical discomfort from the CPAP equipment  - You feel excessively sleepy during the day (this indicates your sleep apnea needs treatment)  You're on the right track. These strategies will help you continue building tolerance to CPAP while you wait for formal CBT-I therapy.  It was a pleasure seeing you today! Your health and satisfaction are our top priorities.  Bernardino Cone, MD  VISIT SUMMARY: During your visit, we discussed your ongoing difficulties with sleep, particularly your challenges with CPAP therapy for severe sleep apnea. We reviewed your recent sleep study and your experiences with different sleep aids and strategies. We also addressed your feelings of anxiety and discomfort related to CPAP use and your overall sleep quality.  YOUR PLAN: -PRIMARY INSOMNIA: Primary insomnia is a  condition where you have trouble falling asleep or staying asleep. Your chronic insomnia is worsened by severe obstructive sleep apnea. We recommend cognitive behavioral therapy for insomnia (CBTI) as a long-term solution, which has an 80% success rate. For short-term relief, we have prescribed Lunesta to help you tolerate CPAP therapy. It is important not to rely on sleep medications long-term due to potential cognitive side effects and reduced efficacy. We have provided you with a CBTI handout and guidance. You should take Lunesta instead of lorazepam  at bedtime for a few nights and aim to use CPAP for at least two and a half hours per night. Additionally, maintain a consistent sleep schedule, avoid screens before bed, and limit your time in bed to 5-6 hours initially.  -SEVERE OBSTRUCTIVE SLEEP APNEA: Severe obstructive sleep apnea is a condition where your airway becomes blocked during sleep, causing significant drops in oxygen levels. You have difficulty tolerating CPAP due to anxiety and discomfort, but recent progress includes using CPAP for up to two hours. Consistent CPAP use is crucial for effective treatment and preventing oxygen desaturation. We emphasize CBTI to improve  CPAP adherence. A mouth guard is suggested to reduce pressure and enhance comfort with CPAP. Continue using CPAP with the mouth guard and apply the CBTI techniques to improve tolerance.  INSTRUCTIONS: Please follow up with us  after you have tried the prescribed Lunesta and CBTI techniques for a few nights. Continue using the Night Owl device for monitoring, and we will review the data once it is received. Maintain your daily walking routine and consistent sleep schedule. If you have any concerns or experience any side effects, please contact our office.  Your Providers PCP: Jesus Bernardino MATSU, MD,  [phone]) Referring Provider: Jesus Bernardino MATSU, MD,  [phone])  NEXT STEPS: [x]  Early Intervention: Schedule sooner  appointment, call our on-call services, or go to emergency room if there is any significant Increase in pain or discomfort New or worsening symptoms Sudden or severe changes in your health [x]  Flexible Follow-Up: We recommend a No follow-ups on file. for optimal routine care. This allows for progress monitoring and treatment adjustments. [x]  Preventive Care: Schedule your annual preventive care visit! It's typically covered by insurance and helps identify potential health issues early. [x]  Lab & X-ray Appointments: Incomplete tests scheduled today, or call to schedule. X-rays: [facility] (M-F, 8:30am-noon or 1pm-5pm). [x]  Medical Information Release: Sign a release form at front desk to obtain relevant medical information we don't have.  MAKING THE MOST OF OUR FOCUSED 20 MINUTE APPOINTMENTS: [x]   Clearly state your top concerns at the beginning of the visit to focus our discussion [x]   If you anticipate you will need more time, please inform the front desk during scheduling - we can book multiple appointments in the same week. [x]   If you have transportation problems- use our convenient video appointments or ask about transportation support. [x]   We can get down to business faster if you use MyChart to update information before the visit and submit non-urgent questions before your visit. Thank you for taking the time to provide details through MyChart.  Let our nurse know and she can import this information into your encounter documents.  Arrival and Wait Times: [x]   Arriving on time ensures that everyone receives prompt attention. [x]   Early morning (8a) and afternoon (1p) appointments tend to have shortest wait times. [x]   Unfortunately, we cannot delay appointments for late arrivals or hold slots during phone calls.  Getting Answers and Following Up [x]   Simple Questions & Concerns: For quick questions or basic follow-up after your visit, reach us  at [phone] or  MyChart messaging. [x]   Complex Concerns: If your concern is more complex, scheduling an appointment might be best. Discuss this with the staff to find the most suitable option. [x]   Lab & Imaging Results: We'll contact you directly if results are abnormal or you don't use MyChart. Most normal results will be on MyChart within 2-3 business days, with a review message from Dr. Jesus. Haven't heard back in 2 weeks? Need results sooner? Contact us  at [phone]. [x]   Referrals: Our referral coordinator will manage specialist referrals. The specialist's office should contact you within 2 weeks to schedule an appointment. Call us  if you haven't heard from them after 2 weeks.  Staying Connected [x]   MyChart: Activate your MyChart for the fastest way to access results and message us . See the last page of this paperwork for instructions on how to activate.  Bring to Your Next Appointment [x]   Medications: Please bring all your medication bottles to your next appointment to ensure  we have an accurate record of your prescriptions. [x]   Health Diaries: If you're monitoring any health conditions at home, keeping a diary of your readings can be very helpful for discussions at your next appointment.  Billing [x]   X-ray & Lab Orders: These are billed by separate companies. Contact the invoicing company directly for questions or concerns. [x]   Visit Charges: Discuss any billing inquiries with our administrative services team.  Your Satisfaction Matters [x]   Share Your Experience: We strive for your satisfaction! If you have any complaints, or preferably compliments, please let Dr. Jesus know directly or contact our Practice Administrators, Manuelita Rubin or [employer], by asking at the front desk.   Reviewing Your Records [x]   Review this early draft of your clinical encounter notes below and the final encounter summary tomorrow on MyChart after its been completed.  All orders placed so far are  visible here: Primary insomnia Assessment & Plan: Chronic insomnia is worsened by severe obstructive sleep apnea. He struggles with CPAP due to anxiety and discomfort but has recently managed to use it for up to two hours. Cognitive behavioral therapy for insomnia (CBTI) is recommended as a long-term solution, with an 80% success rate. Short-term use of Lunesta is advised to help with initial CPAP tolerance. It is important not to rely on sleep medications long-term due to potential cognitive side effects and reduced efficacy. CBTI has been initiated with a handout and guidance. Lunesta, 5 tablets, is prescribed for short-term use to aid CPAP tolerance, to be taken instead of lorazepam  at bedtime for a few nights. He is encouraged to use CPAP for at least two and a half hours per night. Sleep hygiene advice includes maintaining a consistent sleep schedule, avoiding screens before bed, and limiting time in bed to 5-6 hours initially.  Status: Chronic, severe. Patient reports persistent insomnia, worsened since July of previous year. Sleep initiation and maintenance are significantly impaired, with notable anxiety and dread associated with bedtime. Sleep hygiene and pharmacologic interventions (doxepin, lorazepam ) have not provided adequate symptom relief. Contributing Factors: Severe obstructive sleep apnea (OSA), CPAP intolerance, anxiety, history of traumatic brain injury, bipolar disorder, and polypharmacy. Impact: Profound daytime fatigue, cognitive impairment, emotional distress, impaired quality of life. Education provided: Extensive counseling on cognitive behavioral therapy for insomnia (CBTI), including handout and in-person instruction. Techniques discussed: stimulus control, sleep restriction, relaxation strategies, and management of maladaptive thoughts. Sleep schedule: Patient maintains consistent bedtime/wake time, avoids caffeine and alcohol , exercises regularly, and minimizes screen time  before bed. Unable to nap during daytime. CBTI plan: Initiated in office due to delay in referral; patient instructed on progressive muscle relaxation, mindfulness, and breathwork. Advised to limit time in bed to 5-6 hours initially to increase sleep drive and facilitate CPAP adaptation. Other Considerations Psychiatric comorbidities: Bipolar disorder, depression, anxiety. Patient reports psychiatric medications are not providing sleep benefit. Medical complexity: History of pulmonary embolism, DVT, enlarged heart, DJD, vocal cord paralysis, and TBI. No acute distress or cognitive impairment on exam. Social history: No tobacco, alcohol , or drug use. Active lifestyle (walks daily).   Severe obstructive sleep apnea Assessment & Plan: He experiences significant oxygen desaturation events and has difficulty tolerating CPAP due to anxiety and discomfort. Recent progress includes tolerating CPAP for up to two hours. Consistent CPAP use is crucial for effective treatment and preventing oxygen desaturation. CBTI is emphasized to improve CPAP adherence. A mouth guard is suggested to reduce pressure and enhance comfort with CPAP. He is encouraged to continue using CPAP with  the mouth guard and provided with CBTI techniques to improve tolerance. Status: Confirmed by prior sleep study. Patient experiences frequent oxygen desaturation events during sleep, with inability to tolerate CPAP for sustained periods. CPAP adherence is complicated by anxiety, discomfort, and poor sleep quality. Recent Progress: Able to tolerate CPAP (nasal mask with mouth guard) for up to 2 hours while awake, but unable to fall asleep with device in place. Notable improvement in anxiety with device compared to previous night. Risks: Untreated OSA with desaturation poses risk for cardiovascular, neurocognitive, and metabolic complications. Device: Nasal CPAP with mouth guard. Mouth guard recommended to reduce required pressure and improve  comfort. Goals: Gradually increase duration of CPAP use each night (target 2.5 hours minimum), with emphasis on tolerability and anxiety reduction. Patient encouraged to use CPAP with mouth guard and apply CBTI techniques nightly.   Medication management  Other orders -     Zolpidem  Tartrate; Take 1 tablet (5 mg total) by mouth at bedtime as needed for sleep.  Dispense: 30 tablet; Refill: 0

## 2024-06-19 NOTE — Assessment & Plan Note (Signed)
 He experiences significant oxygen desaturation events and has difficulty tolerating CPAP due to anxiety and discomfort. Recent progress includes tolerating CPAP for up to two hours. Consistent CPAP use is crucial for effective treatment and preventing oxygen desaturation. CBTI is emphasized to improve CPAP adherence. A mouth guard is suggested to reduce pressure and enhance comfort with CPAP. He is encouraged to continue using CPAP with the mouth guard and provided with CBTI techniques to improve tolerance. Status: Confirmed by prior sleep study. Patient experiences frequent oxygen desaturation events during sleep, with inability to tolerate CPAP for sustained periods. CPAP adherence is complicated by anxiety, discomfort, and poor sleep quality. Recent Progress: Able to tolerate CPAP (nasal mask with mouth guard) for up to 2 hours while awake, but unable to fall asleep with device in place. Notable improvement in anxiety with device compared to previous night. Risks: Untreated OSA with desaturation poses risk for cardiovascular, neurocognitive, and metabolic complications. Device: Nasal CPAP with mouth guard. Mouth guard recommended to reduce required pressure and improve comfort. Goals: Gradually increase duration of CPAP use each night (target 2.5 hours minimum), with emphasis on tolerability and anxiety reduction. Patient encouraged to use CPAP with mouth guard and apply CBTI techniques nightly.
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
## 2024-06-21 ENCOUNTER — Ambulatory Visit: Payer: Self-pay | Admitting: Internal Medicine

## 2024-06-21 DIAGNOSIS — G4709 Other insomnia: Secondary | ICD-10-CM

## 2024-07-04 ENCOUNTER — Encounter: Payer: Self-pay | Admitting: Internal Medicine

## 2024-07-04 ENCOUNTER — Ambulatory Visit: Payer: Medicare (Managed Care) | Admitting: Internal Medicine

## 2024-07-04 VITALS — BP 110/88 | HR 88 | Temp 98.3°F | Resp 16 | Ht 71.0 in | Wt 223.2 lb

## 2024-07-04 DIAGNOSIS — Z79899 Other long term (current) drug therapy: Secondary | ICD-10-CM | POA: Insufficient documentation

## 2024-07-04 DIAGNOSIS — E291 Testicular hypofunction: Secondary | ICD-10-CM | POA: Insufficient documentation

## 2024-07-04 DIAGNOSIS — F458 Other somatoform disorders: Secondary | ICD-10-CM

## 2024-07-04 DIAGNOSIS — Z789 Other specified health status: Secondary | ICD-10-CM | POA: Insufficient documentation

## 2024-07-04 DIAGNOSIS — G4733 Obstructive sleep apnea (adult) (pediatric): Secondary | ICD-10-CM

## 2024-07-04 DIAGNOSIS — Z9989 Dependence on other enabling machines and devices: Secondary | ICD-10-CM

## 2024-07-04 LAB — CBC WITH DIFFERENTIAL/PLATELET
Basophils Absolute: 0 10*3/uL (ref 0.0–0.1)
Basophils Relative: 1 % (ref 0.0–3.0)
Eosinophils Absolute: 0.1 10*3/uL (ref 0.0–0.7)
Eosinophils Relative: 2.8 % (ref 0.0–5.0)
HCT: 45.8 % (ref 39.0–52.0)
Hemoglobin: 15.4 g/dL (ref 13.0–17.0)
Lymphocytes Relative: 23.5 % (ref 12.0–46.0)
Lymphs Abs: 1.1 10*3/uL (ref 0.7–4.0)
MCHC: 33.6 g/dL (ref 30.0–36.0)
MCV: 94.7 fl (ref 78.0–100.0)
Monocytes Absolute: 0.6 10*3/uL (ref 0.1–1.0)
Monocytes Relative: 12.7 % — ABNORMAL HIGH (ref 3.0–12.0)
Neutro Abs: 2.8 10*3/uL (ref 1.4–7.7)
Neutrophils Relative %: 60 % (ref 43.0–77.0)
Platelets: 168 10*3/uL (ref 150.0–400.0)
RBC: 4.84 Mil/uL (ref 4.22–5.81)
RDW: 13.4 % (ref 11.5–15.5)
WBC: 4.7 10*3/uL (ref 4.0–10.5)

## 2024-07-04 LAB — PSA: PSA: 0.93 ng/mL (ref 0.10–4.00)

## 2024-07-04 MED ORDER — TESTOSTERONE CYPIONATE 200 MG/ML IM SOLN: 100.0000 mg | INTRAMUSCULAR | 0 refills | Status: AC

## 2024-07-04 NOTE — Assessment & Plan Note (Signed)
 Created AVS handout Did extensive counseling No worries at all! Switching from a ResMed to a Luna G3 ([employer]) changes the interface and terminology slightly, but the physics of helping your aerophagia remains the same. The Luna G3 is actually quite robust for managing these issues if you know where the hidden settings are. Here is how to coach this patient specifically for the Luna G3 APAP while using nasal prongs with a septal deviation.  1. Primary Aerophagia Intervention: RESlex On the Luna G3, the equivalent to ResMed's EPR is called RESlex. This reduces the pressure when the patient exhales. The Setting: Set RESlex to 3. Mode: Ensure it is set to Patient or Always so it functions throughout the entire night, not just during the ramp-up period. Why: Lowering the resistance during exhalation is the #1 way to stop the patient from gulping air into the stomach. 2. Refining the Pressure (Auto-CPAP Settings) Since the patient has a septal deviation, they likely struggle with the starving for air feeling if the starting pressure is too low, but swallow air if the max pressure is too high. P-Max (Maximum Pressure): If the patient's AHI is well-controlled, consider capping the Max Pressure slightly (e.g., if it's at 20, try 15). High pressure spikes are the primary trigger for aerophagia. Sensitivity: Set the Sensitivity to a lower number (like 2 or 3). This makes the machine less aggressive in jumping to higher pressures when it detects an event, which can prevent the sudden bursts of air that cause swallowing.  3. Addressing the Septal Deviation & Leaks A deviated septum often creates unbalanced resistance. Nasal prongs are sensitive to this; if one side is blocked, the other prong may leak as it tries to handle the full flow. Tube Temperature & Humidity: With a deviated septum, nasal membranes are prone to swelling. Set the Heated Tubing to Auto. If the nose stays clear  and hydrated, the patient is less likely to mouth-breathe or fight the mask. Ramp Time: Set the Ramp to at least 20-30 minutes. This allows the patient to acclimate to the nasal prongs and find a comfortable seal before the pressure climbs. Mask Selection: On the Luna G3, ensure the mask type is set to Pillows (this is usually represented by an icon or the word Pillow in the setup menu).  4. Accessing the Clinical Menu on Luna G3 To change RESlex or Pressure limits, you must enter the Clinical Menu: Plug in the machine and ensure it is in Standby mode (not running). Press and hold the Home button and the Control Dial simultaneously for about 3-5 seconds. The screen will change to the Clinical Settings menu.  Troubleshooting Table for the Patient Symptom Adjustment on Luna G3  Gasping / Air Swallowing Increase RESlex to 3.  Whistling / One-sided Leak Check Mask Fit; ensure prongs are centered despite the deviation.  Dry Mouth / Mouth Gaping Increase Humidity; consider a Chin Strap to prevent the chimney effect.  Bloating in the Morning Lower the P-Max (Max Pressure) in the Clinical Menu.   Next Step for the Patient The [employer] has a Sports Administrator test feature. Would you like me to walk you through how to run the Mask Fit test on the G3 to see if the septal deviation is causing a leak that the patient isn't noticing?

## 2024-07-04 NOTE — Patient Instructions (Addendum)
 1. Primary Aerophagia Intervention: RESlex On the Luna G3, the equivalent to ResMed's EPR is called RESlex. This reduces the pressure when the patient exhales. The Setting: Set RESlex to 3. Mode: Ensure it is set to Patient or Always so it functions throughout the entire night, not just during the ramp-up period. Why: Lowering the resistance during exhalation is the #1 way to stop the patient from gulping air into the stomach. 2. Refining the Pressure (Auto-CPAP Settings) Since the patient has a septal deviation, they likely struggle with the starving for air feeling if the starting pressure is too low, but swallow air if the max pressure is too high. P-Max (Maximum Pressure): If the patient's AHI is well-controlled, consider capping the Max Pressure slightly (e.g., if it's at 20, try 15). High pressure spikes are the primary trigger for aerophagia. Sensitivity: Set the Sensitivity to a lower number (like 2 or 3). This makes the machine less aggressive in jumping to higher pressures when it detects an event, which can prevent the sudden bursts of air that cause swallowing.  3. Addressing the Septal Deviation & Leaks A deviated septum often creates unbalanced resistance. Nasal prongs are sensitive to this; if one side is blocked, the other prong may leak as it tries to handle the full flow. Tube Temperature & Humidity: With a deviated septum, nasal membranes are prone to swelling. Set the Heated Tubing to Auto. If the nose stays clear and hydrated, the patient is less likely to mouth-breathe or fight the mask. Ramp Time: Set the Ramp to at least 20-30 minutes. This allows the patient to acclimate to the nasal prongs and find a comfortable seal before the pressure climbs. Mask Selection: On the Luna G3, ensure the mask type is set to Pillows (this is usually represented by an icon or the word Pillow in the setup menu).  4. Accessing the Clinical Menu on Luna G3 To change RESlex or  Pressure limits, you must enter the Clinical Menu: Plug in the machine and ensure it is in Standby mode (not running). Press and hold the Home button and the Control Dial simultaneously for about 3-5 seconds. The screen will change to the Clinical Settings menu.  Troubleshooting Table for the Patient Symptom Adjustment on Luna G3  Gasping / Air Swallowing Increase RESlex to 3.  Whistling / One-sided Leak Check Mask Fit; ensure prongs are centered despite the deviation.  Dry Mouth / Mouth Gaping Increase Humidity; consider a Chin Strap to prevent the chimney effect.  Bloating in the Morning Lower the P-Max (Max Pressure) in the Clinical Menu.   The Mask Fit feature on the [employer] [facility] is a fantastic pre-flight check. Instead of waiting until you're deep in REM sleep to find out your mask is leaking, this feature runs the machine at a higher pressure for a few minutes while you're awake so you can find and fix leaks immediately. Given the septal deviation, this is crucial because one nostril may be angled differently, causing the nasal prongs to unseat as the pressure rises.  Step-by-Step: Running the Mask Fit Test Put on your mask: Todd your nasal prong mask as you normally would. Ensure the headgear is snug but not overtightened (overtightening can actually collapse the silicone seal). Navigate the Menu: On the main standby screen of your Luna G3, rotate the dial to highlight the Setup or Maintenance icon (depending on your specific firmware version, it may also be labeled as a check-mark or mask icon). Start the Test: Medical Laboratory Scientific Officer  Fit. The machine will immediately start blowing air at a higher therapeutic pressure. The Feedback Loop: * The screen will show a visual indicator--usually a [employer] Face for a good seal or a [employer] Face if a significant leak is detected. If you see red, you will likely hear a hissing sound.  Coaching Tips for Septal Deviation Because your nasal anatomy  is asymmetrical, centered might not feel right for you. Use these tips during the test: The Wiggle Technique: If the test shows a leak, don't just pull the straps tighter. Instead, gently pull the nasal prongs about half an inch away from your nose, let them inflate with air, and then slowly guide them back onto your nostrils. This helps the silicone cradle your specific anatomy. Check the Deviated Side: Pay extra attention to the side where your septum leans. You may need to tilt the angle of the nasal pillows slightly to ensure that prong is making full contact with the nostril opening. Lie Down: Do not perform this test while sitting up. Your face shape changes when you lie down. Perform the Mask Fit test in your actual sleeping position.  When to Stop The test usually runs for about 3 minutes or until you press the dial to stop it. If you can get a Green result for 60 seconds while moving your head from side to side, you are ready for the night.   The iCode system is a clever way to get a detailed report card on your sleep therapy without needing an SD card or a complicated Wi-Fi setup. For someone dealing with aerophagia and a deviated septum, this data is gold--it tells you exactly how high your pressure went and how bad your leaks were while you were unconscious.  What is iCode? The machine generates an alphanumeric string (a mix of letters and numbers) that encrypts your sleep data. You then plug this code into a website or app to see your: Average Pressure: (If this is high, it explains the aerophagia/bloating). Leak Rate: (If this is high, it explains why your nose feels dry or why you're waking up). AHI (Apnea-Hypopnea Index): (How many times per hour you stopped breathing).  How to Get Your iCode Stop Therapy: Ensure the machine is not blowing air and is on the standby screen. Find the Icon: Rotate the dial to the iCode icon (it looks like a small document or a QR  code). Choose Your Range: You will see options like iCode 1 (last night), iCode 7 (last week), or iCode 30. For troubleshooting aerophagia, use iCode 1 to see if yesterday's setting changes helped. Write it Down: A string of characters will appear on the screen (e.g., [phone]).  How to Read the Report Once you have the code, you have two main ways to translate it: Option A: The Smartphone App (Best for daily tracking) Download the P[employer] Link app (available on iOS and Android). Open the app and select the QR Code scanner. On your Luna G3, toggle the iCode screen until the QR Code appears. Scan it with your phone. The app will immediately show you a dashboard of your sleep. Option B: The Website (Best for [employer] for your doctor) Go to www.[email or web address]. Select Patient and enter the code you wrote down from the screen. It will generate a report you can save or print.  What to Look For (The [employer]) Since you are managing aerophagia and leaks, check these specific numbers: 95% Pressure: This is the pressure level  you were at or below for 95% of the night. If this number is very close to your Max Pressure (e.g., 14.8 on a max of 15), the machine is struggling to keep your airway open through the deviated septum, and that high pressure is likely what's causing your bloating. Leak (L/min): If your leak rate is consistently above 25 L/min, your nasal prongs aren't sealing well. This runaway air often causes people to swallow air as they struggle to breathe against the leak.  A Pro-Tip for your Septal Deviation If your iCode shows a high leak rate but your AHI is also high, it means the air is escaping before it can actually help you breathe. This often happens because the deviated septum creates high resistance, and the air takes the path of least resistance--out the sides of the prongs.  After-Visit Instructions: Optimizing Your CPAP Therapy Goal: Reduce stomach bloating  (aerophagia), stop mask leaks, and manage breathing with a deviated septum. 1. Machine Adjustments (Luna G3 APAP) To reduce the amount of air entering your stomach, we need to make it easier for you to exhale. Action: Ensure RESlex is turned ON and set to Level 3. How to do it: 1. From the standby screen, press and hold the Home button and the Control Dial for 5 seconds to enter the Clinical Menu. 2. Navigate to RESlex. Set it to 3. 3. Ensure it is set to Always so it helps you all night long. Why: This drops the pressure the moment you breathe out, preventing you from swallowing the air. 2. Managing Your Nasal Prongs & Septal Deviation Since your septum is deviated, one nostril is narrower than the other. This often causes the mask to leak or whistle. The Mask Fit Test: Every night for the next week, run the Mask Fit test located in your [employer]. Lie down in your sleeping position. If the screen shows a [employer] Face, pull the prongs slightly away from your nose, let them inflate, and gently reseat them. Because of your deviation, the prongs may need to be angled slightly to one side to seal correctly. Humidification: Set your Heated Tubing to Auto. Keeping your nasal passages hydrated prevents the swelling that makes a deviated septum feel even more blocked. 3. Tracking Your Progress with the App To see if these changes are working, you will use the [employer] [facility] app (formerly PAP Link). Download: [employer] [facility] on your smartphone. Daily Check: 1. On your Luna G3, go to Report > iCode QR+. 2. Open the app and scan the QR code on the machine's screen. What to look for: 95% Pressure: If this is very high and your bloating is worse, we may need to lower your maximum pressure. Leak Rate: Aim for under 25 L/min. If it is higher, your septal deviation is likely causing the prongs to shift. 4. When to Call the Office Contact us  if you experience any of the  following: Severe abdominal pain or bloating that does not improve with the RESlex setting. A Leak Rate on your app that stays consistently above 30 L/min. Persistent starving for air feeling when trying to fall asleep.

## 2024-07-04 NOTE — Progress Notes (Unsigned)
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 minutes. This allows the patient to acclimate to the nasal prongs and find a comfortable seal before the pressure  climbs. Mask Selection: On the Luna G3, ensure the mask type is set to Pillows (this is usually represented by an  icon or the word Pillow in the setup menu).  4. Accessing the Clinical Menu on Luna G3 To change RESlex or Pressure limits, you must enter the Clinical Menu: Plug in the machine and ensure it is in Standby mode (not running). Press and hold the Home button and the Control Dial simultaneously for about 3-5 seconds. The screen will change to the Clinical Settings menu.  Troubleshooting Table for the Patient Symptom Adjustment on Luna G3  Gasping / Air Swallowing Increase RESlex to 3.  Whistling / One-sided Leak Check Mask Fit; ensure prongs are centered despite the deviation.  Dry Mouth / Mouth Gaping Increase Humidity; consider a Chin Strap to prevent the chimney effect.  Bloating in the Morning Lower the P-Max (Max Pressure) in the Clinical Menu.   Next Step for the Patient The [employer] [employer] has a Sports Administrator test feature. Would you like me to walk you through how to run the Mask Fit test on the G3 to see if the septal deviation is causing a leak that the patient isn't noticing?  Hypogonadism in male  Medication management   No results found for: PSA1, PSA  Lab Results  Component Value Date   WBC 4.0 02/04/2024   Lab Results  Component Value Date   HGB 16.7 02/04/2024     {Assessment and Plan Assessment & Plan    }  ORDER ASSOCIATIONS  #   DIAGNOSIS / CONDITION ICD-10 ENCOUNTER ORDER  No diagnosis found.       Orders Placed in Encounter:   Lab Orders  No laboratory test(s) ordered today   Imaging Orders  No imaging studies ordered today   Referral Orders  No referral(s) requested today   No orders of the defined types were placed in this encounter.   No orders of the defined types were placed in this encounter. ED Discharge Orders     None         This document was synthesized by artificial intelligence ([employer]) using  HIPAA-compliant recording of the clinical interaction;   We discussed the use of AI scribe software for clinical note transcription with the patient, who gave verbal consent to proceed. additional Info: This encounter employed state-of-the-art, real-time, collaborative documentation. The patient actively reviewed and assisted in updating their electronic medical record on a shared screen, ensuring transparency and facilitating joint problem-solving for the problem list, overview, and plan. This approach promotes accurate, informed care. The treatment plan was discussed and reviewed in detail, including medication safety, potential side effects, and all patient questions. We confirmed understanding and comfort with the plan. Follow-up instructions were established, including contacting the office for any concerns, returning if symptoms worsen, persist, or new symptoms develop, and precautions for potential emergency department visits.

## 2024-07-15 ENCOUNTER — Institutional Professional Consult (permissible substitution) (INDEPENDENT_AMBULATORY_CARE_PROVIDER_SITE_OTHER): Payer: Medicare (Managed Care) | Admitting: Otolaryngology

## 2024-07-18 ENCOUNTER — Ambulatory Visit: Payer: Medicare (Managed Care) | Admitting: Internal Medicine

## 2024-07-21 ENCOUNTER — Institutional Professional Consult (permissible substitution) (INDEPENDENT_AMBULATORY_CARE_PROVIDER_SITE_OTHER): Payer: Medicare (Managed Care) | Admitting: Otolaryngology

## 2025-03-18 ENCOUNTER — Ambulatory Visit: Payer: Medicare (Managed Care)
